# Patient Record
Sex: Male | Born: 1940 | Race: White | Hispanic: No | Marital: Single | Smoking: Never smoker
Health system: Southern US, Community
[De-identification: ages and names within clinical notes are randomized; demographics above are authoritative.]

## PROBLEM LIST (undated history)

## (undated) DIAGNOSIS — M1990 Unspecified osteoarthritis, unspecified site: Secondary | ICD-10-CM

## (undated) DIAGNOSIS — I10 Essential (primary) hypertension: Secondary | ICD-10-CM

## (undated) DIAGNOSIS — F79 Unspecified intellectual disabilities: Secondary | ICD-10-CM

## (undated) DIAGNOSIS — E785 Hyperlipidemia, unspecified: Secondary | ICD-10-CM

## (undated) DIAGNOSIS — R451 Restlessness and agitation: Secondary | ICD-10-CM

## (undated) HISTORY — PX: CATARACT EXTRACTION W/ INTRAOCULAR LENS  IMPLANT, BILATERAL: SHX1307

---

## 2013-01-02 ENCOUNTER — Emergency Department (HOSPITAL_COMMUNITY)
Admission: EM | Admit: 2013-01-02 | Discharge: 2013-01-02 | Disposition: A | Discharge status: Skilled Nursing Facility | Payer: Medicare Other | Attending: Emergency Medicine | Admitting: Emergency Medicine

## 2013-01-02 ENCOUNTER — Encounter (HOSPITAL_COMMUNITY): Payer: Self-pay | Admitting: Emergency Medicine

## 2013-01-02 ENCOUNTER — Emergency Department (HOSPITAL_COMMUNITY): Payer: Medicare Other

## 2013-01-02 DIAGNOSIS — S8000XA Contusion of unspecified knee, initial encounter: Secondary | ICD-10-CM | POA: Insufficient documentation

## 2013-01-02 DIAGNOSIS — W010XXA Fall on same level from slipping, tripping and stumbling without subsequent striking against object, initial encounter: Secondary | ICD-10-CM | POA: Insufficient documentation

## 2013-01-02 DIAGNOSIS — Y9289 Other specified places as the place of occurrence of the external cause: Secondary | ICD-10-CM | POA: Insufficient documentation

## 2013-01-02 DIAGNOSIS — I10 Essential (primary) hypertension: Secondary | ICD-10-CM | POA: Insufficient documentation

## 2013-01-02 DIAGNOSIS — S8001XA Contusion of right knee, initial encounter: Secondary | ICD-10-CM

## 2013-01-02 DIAGNOSIS — F79 Unspecified intellectual disabilities: Secondary | ICD-10-CM | POA: Insufficient documentation

## 2013-01-02 DIAGNOSIS — G20 Parkinson's disease: Secondary | ICD-10-CM | POA: Insufficient documentation

## 2013-01-02 DIAGNOSIS — M1990 Unspecified osteoarthritis, unspecified site: Secondary | ICD-10-CM | POA: Insufficient documentation

## 2013-01-02 DIAGNOSIS — W19XXXA Unspecified fall, initial encounter: Secondary | ICD-10-CM

## 2013-01-02 DIAGNOSIS — Z79899 Other long term (current) drug therapy: Secondary | ICD-10-CM | POA: Insufficient documentation

## 2013-01-02 DIAGNOSIS — Y9389 Activity, other specified: Secondary | ICD-10-CM | POA: Insufficient documentation

## 2013-01-02 DIAGNOSIS — G20A1 Parkinson's disease without dyskinesia, without mention of fluctuations: Secondary | ICD-10-CM | POA: Insufficient documentation

## 2013-01-02 HISTORY — DX: Essential (primary) hypertension: I10

## 2013-01-02 HISTORY — DX: Unspecified intellectual disabilities: F79

## 2013-01-02 HISTORY — DX: Unspecified osteoarthritis, unspecified site: M19.90

## 2013-01-02 LAB — COMPREHENSIVE METABOLIC PANEL
AST: 17 U/L (ref 0–37)
Albumin: 3.1 g/dL — ABNORMAL LOW (ref 3.5–5.2)
Alkaline Phosphatase: 60 U/L (ref 39–117)
BUN: 24 mg/dL — ABNORMAL HIGH (ref 6–23)
CO2: 25 mEq/L (ref 19–32)
Calcium: 9 mg/dL (ref 8.4–10.5)
Chloride: 95 mEq/L — ABNORMAL LOW (ref 96–112)
GFR calc Af Amer: >90 mL/min (ref >90)
Glucose, Bld: 103 mg/dL — ABNORMAL HIGH (ref 70–99)
Potassium: 4 mEq/L (ref 3.5–5.1)
Sodium: 130 mEq/L — ABNORMAL LOW (ref 135–145)
Total Protein: 6.7 g/dL (ref 6.0–8.3)

## 2013-01-02 LAB — CBC WITH DIFFERENTIAL/PLATELET
Basophils Relative: 0 % (ref 0–1)
Eosinophils Absolute: 0.2 10*3/uL (ref 0.0–0.7)
Eosinophils Relative: 3 % (ref 0–5)
HCT: 39.5 % (ref 39.0–52.0)
Lymphocytes Relative: 18 % (ref 12–46)
Lymphs Abs: 1.4 10*3/uL (ref 0.7–4.0)
MCH: 30.2 pg (ref 26.0–34.0)
MCHC: 34.7 g/dL (ref 30.0–36.0)
MCV: 87 fL (ref 78.0–100.0)
Monocytes Relative: 9 % (ref 3–12)
Neutro Abs: 5.3 10*3/uL (ref 1.7–7.7)
Neutrophils Relative %: 69 % (ref 43–77)
Platelets: 266 10*3/uL (ref 150–400)
RBC: 4.54 MIL/uL (ref 4.22–5.81)

## 2013-01-02 LAB — URINALYSIS, ROUTINE W REFLEX MICROSCOPIC
Glucose, UA: NEGATIVE mg/dL
Leukocytes, UA: NEGATIVE
Nitrite: NEGATIVE
Specific Gravity, Urine: 1.023 (ref 1.005–1.030)
pH: 5 (ref 5.0–8.0)

## 2013-01-02 LAB — TROPONIN I: Troponin I: <0.3 ng/mL (ref <0.30)

## 2013-01-02 MED ORDER — SODIUM CHLORIDE 0.9 % IV BOLUS (SEPSIS)
500.0000 mL | Freq: Once | INTRAVENOUS | Status: AC
  Administered 2013-01-02: 500 mL via INTRAVENOUS

## 2013-01-02 NOTE — ED Notes (Signed)
Pt from [facility] via EMS s/p fall. Pt hit head after tripping on his shoes, has mild abrasion to forehead, no LOC. Pt c/o R knee pain. Pt A&O and in NAD

## 2013-01-02 NOTE — ED Provider Notes (Signed)
CSN: [id number]     Arrival date & time 01/02/13  1632 History   First MD Initiated Contact with Patient 01/02/13 1717     Chief Complaint  Patient presents with  . Fall   (Consider location/radiation/quality/duration/timing/severity/associated sxs/prior Treatment) HPI Mr. Gary [name] is a 72 year old male who presents today from an assisted living facility with reports of a fall. He has a history of mental retardation and Parkinson's disease. He states he was walking with his cane when he lost his balance. He has some pain in his right knee and also thinks that he hit his head but does not know if he lost consciousness. He denies other pain or injuries from the fall. He does not think he lost consciousness. He denies chest pain, neck pain, back pain, weakness, or dyspnea. Past Medical History  Diagnosis Date  . Mental retardation   . Hypertension   . Parkinson's disease   . Osteoarthritis    History reviewed. No pertinent past surgical history. History reviewed. No pertinent family history. History  Substance Use Topics  . Smoking status: Not on file  . Smokeless tobacco: Not on file  . Alcohol Use: Not on file    Review of Systems  All other systems reviewed and are negative.    Allergies  Review of patient's allergies indicates no known allergies.  Home Medications   Current Outpatient Rx  Name  Route  Sig  Dispense  Refill  . chlorhexidine (PERIDEX) 0.12 % solution   Mouth/Throat   Use as directed 15 mLs in the mouth or throat 2 (two) times daily.         Marland Kitchen etodolac (LODINE) 400 MG tablet   Oral   Take 400 mg by mouth 2 (two) times daily.         . fish oil-omega-3 fatty acids 1000 MG capsule   Oral   Take 2 g by mouth 2 (two) times daily.         Marland Kitchen Perphenazine-Amitriptyline 4-50 MG TABS   Oral   Take 2 tablets by mouth at bedtime.         . propranolol ER (INDERAL LA) 60 MG 24 hr capsule   Oral   Take 60 mg by mouth daily.         . simvastatin  (ZOCOR) 20 MG tablet   Oral   Take 20 mg by mouth at bedtime.          BP 136/67  Pulse 62  Temp(Src) 98.7 F (37.1 C) (Oral)  Resp 14  SpO2 96% Physical Exam  Nursing note and vitals reviewed. Constitutional: He appears well-developed and well-nourished.  HENT:  Head: Normocephalic and atraumatic.  Right Ear: External ear normal.  Left Ear: External ear normal.  Nose: Nose normal.  Mouth/Throat: Oropharynx is clear and moist.  Lips appear dry  Eyes: Conjunctivae and EOM are normal. Pupils are equal, round, and reactive to light.  Neck: Normal range of motion. Neck supple.  Cardiovascular: Normal rate, regular rhythm, normal heart sounds and intact distal pulses.   Pulmonary/Chest: Effort normal and breath sounds normal.  Abdominal: Soft. Bowel sounds are normal.    ED Course  Procedures (including critical care time) Labs Review Labs Reviewed  COMPREHENSIVE METABOLIC PANEL - Abnormal; Notable for the following:    Sodium 130 (*)    Chloride 95 (*)    Glucose, Bld 103 (*)    BUN 24 (*)    Albumin 3.1 (*)    Total Bilirubin  0.2 (*)    GFR calc non Af Amer 88 (*)    All other components within normal limits  CBC WITH DIFFERENTIAL  TROPONIN I  URINALYSIS, ROUTINE W REFLEX MICROSCOPIC   Imaging Review Ct Head Wo Contrast  01/02/2013   CLINICAL DATA:  Fall, altered mental status  EXAM: CT HEAD WITHOUT CONTRAST  TECHNIQUE: Contiguous axial images were obtained from the base of the skull through the vertex without intravenous contrast.  COMPARISON:  Previous exam 11/03/2009 is not available for direct comparison. The report is reviewed.  FINDINGS: Mild cortical volume loss noted with proportional ventricular prominence. Areas of periventricular white matter hypodensity are most compatible with small vessel ischemic change. No acute hemorrhage, infarct, or mass lesion is identified. No midline shift. Orbits and paranasal sinuses are unremarkable. No skull fracture.   IMPRESSION: Chronic findings as above, no acute intracranial finding.   Electronically Signed   By: Christiana Pellant M.D.   On: 01/02/2013 18:24   Dg Knee Complete 4 Views Right  01/02/2013   CLINICAL DATA:  Fall, right knee pain  EXAM: RIGHT KNEE - COMPLETE 4+ VIEW  COMPARISON:  None.  FINDINGS: There is no evidence of fracture, dislocation, or joint effusion. There is no evidence of arthropathy or other focal bone abnormality. Soft tissues are unremarkable.  IMPRESSION: No acute osseous abnormality.   Electronically Signed   By: Genevive Bi M.D.   On: 01/02/2013 18:13   Date: 01/02/2013  Rate: 71  Rhythm: normal sinus rhythm  QRS Axis: left  Intervals: normal  ST/T Wave abnormalities: normal  Conduction Disutrbances:none  Narrative Interpretation:   Old EKG Reviewed: none available    MDM  No diagnosis found. This 72 year old male who presented for evaluation after fall nursing home. His not appear to have any acute injuries from the fall. It is unclear at the fall was totally mechanical but he appears stable medically here.  He is mildly hyponatremic The patient has been ambulated here without difficulty and will be discharged back to his assisted living care facility  Hilario Quarry, MD 01/02/13 2314

## 2013-01-02 NOTE — ED Notes (Signed)
Bed: JX91 Expected date:  Expected time:  Means of arrival:  Comments: EMS leg pain

## 2013-01-02 NOTE — ED Notes (Signed)
Pt made aware of need for urine sample. Pt given urinal

## 2013-01-02 NOTE — ED Notes (Signed)
Patient transported to X-ray 

## 2013-01-02 NOTE — ED Notes (Signed)
Pt ambulated without assistance

## 2013-01-02 NOTE — ED Notes (Signed)
Pt reminded the need for urine sample

## 2014-10-16 ENCOUNTER — Emergency Department (HOSPITAL_COMMUNITY)
Admission: EM | Admit: 2014-10-16 | Discharge: 2014-10-16 | Disposition: A | Discharge status: Home / Self Care | Payer: Medicare Other | Attending: Emergency Medicine | Admitting: Emergency Medicine

## 2014-10-16 ENCOUNTER — Encounter (HOSPITAL_COMMUNITY): Payer: Self-pay | Admitting: *Deleted

## 2014-10-16 ENCOUNTER — Emergency Department (HOSPITAL_COMMUNITY): Payer: Medicare Other

## 2014-10-16 DIAGNOSIS — Z8659 Personal history of other mental and behavioral disorders: Secondary | ICD-10-CM | POA: Diagnosis not present

## 2014-10-16 DIAGNOSIS — Z79899 Other long term (current) drug therapy: Secondary | ICD-10-CM | POA: Insufficient documentation

## 2014-10-16 DIAGNOSIS — M1990 Unspecified osteoarthritis, unspecified site: Secondary | ICD-10-CM | POA: Insufficient documentation

## 2014-10-16 DIAGNOSIS — Y999 Unspecified external cause status: Secondary | ICD-10-CM | POA: Diagnosis not present

## 2014-10-16 DIAGNOSIS — S0993XA Unspecified injury of face, initial encounter: Secondary | ICD-10-CM | POA: Diagnosis present

## 2014-10-16 DIAGNOSIS — I10 Essential (primary) hypertension: Secondary | ICD-10-CM | POA: Diagnosis not present

## 2014-10-16 DIAGNOSIS — S0181XA Laceration without foreign body of other part of head, initial encounter: Secondary | ICD-10-CM

## 2014-10-16 DIAGNOSIS — Y939 Activity, unspecified: Secondary | ICD-10-CM | POA: Insufficient documentation

## 2014-10-16 DIAGNOSIS — G20 Parkinson's disease: Secondary | ICD-10-CM | POA: Diagnosis not present

## 2014-10-16 DIAGNOSIS — W19XXXA Unspecified fall, initial encounter: Secondary | ICD-10-CM

## 2014-10-16 DIAGNOSIS — Y92128 Other place in nursing home as the place of occurrence of the external cause: Secondary | ICD-10-CM | POA: Insufficient documentation

## 2014-10-16 DIAGNOSIS — W1839XA Other fall on same level, initial encounter: Secondary | ICD-10-CM | POA: Insufficient documentation

## 2014-10-16 DIAGNOSIS — S022XXA Fracture of nasal bones, initial encounter for closed fracture: Secondary | ICD-10-CM | POA: Diagnosis not present

## 2014-10-16 MED ORDER — IBUPROFEN 800 MG PO TABS: 800.0000 mg | ORAL_TABLET | Freq: Three times a day (TID) | ORAL | Status: AC | PRN

## 2014-10-16 NOTE — Discharge Instructions (Signed)
Return here as needed.  Followup with your primary care Dr. for recheck °

## 2014-10-16 NOTE — ED Notes (Signed)
Bed: WA03 Expected date:  Expected time:  Means of arrival:  Comments: Fall, face lac

## 2014-10-16 NOTE — ED Provider Notes (Signed)
CSN: [id number]     Arrival date & time 10/16/14  1822 History   First MD Initiated Contact with Patient 10/16/14 1831     Chief Complaint  Patient presents with  . Fall     (Consider location/radiation/quality/duration/timing/severity/associated sxs/prior Treatment) HPI Patient presents to the emergency department following a fall that occurred just prior to arrival.  The patient was outside when his walker got caught on the sidewalk and he fell forward.  The patient did not lose consciousness, and the staff was outside at the time.  Patient has a laceration above his right eye brow abrasions to the nose and lip.  The patient is unable to tell me what happened during the fall.  Patient is unable to tell me if he has any injuries.  He states that he has pain around the nose Past Medical History  Diagnosis Date  . Mental retardation   . Hypertension   . Parkinson's disease   . Osteoarthritis    History reviewed. No pertinent past surgical history. History reviewed. No pertinent family history. History  Substance Use Topics  . Smoking status: Never Smoker   . Smokeless tobacco: Not on file  . Alcohol Use: No    Review of Systems  Level 5 caveat applies due to mental retardation  Allergies  Review of patient's allergies indicates no known allergies.  Home Medications   Prior to Admission medications   Medication Sig Start Date End Date Taking? Authorizing Provider  atorvastatin (LIPITOR) 10 MG tablet Take 10 mg by mouth at bedtime.   Yes Historical Provider, MD  chlorhexidine (PERIDEX) 0.12 % solution Use as directed 15 mLs in the mouth or throat 2 (two) times daily.   Yes Historical Provider, MD  ENSURE (ENSURE) Take 237 mLs by mouth 3 (three) times daily between meals.   Yes Historical Provider, MD  etodolac (LODINE) 400 MG tablet Take 400 mg by mouth 2 (two) times daily. TAKE AT 9AM AND THEN AT 5PM WITH FOOD   Yes Historical Provider, MD  fish oil-omega-3 fatty acids 1000  MG capsule Take 2 g by mouth 2 (two) times daily.   Yes Historical Provider, MD  perphenazine (TRILAFON) 8 MG tablet Take 8 mg by mouth at bedtime.   Yes Historical Provider, MD  triamcinolone (KENALOG) 0.025 % cream Apply 1 application topically 4 (four) times daily as needed (FOR AREAS OF ITCHING).   Yes Historical Provider, MD   BP 137/72 mmHg  Pulse 82  Temp(Src) 99.7 F (37.6 C) (Oral)  Resp 16  SpO2 91% Physical Exam  Constitutional: He is oriented to person, place, and time. He appears well-developed and well-nourished. No distress.  HENT:  Head: Normocephalic.    Nose:    Mouth/Throat: Oropharynx is clear and moist.  Eyes: Pupils are equal, round, and reactive to light.  Neck: Normal range of motion. Neck supple.  Cardiovascular: Normal rate, regular rhythm and normal heart sounds.  Exam reveals no gallop and no friction rub.   No murmur heard. Pulmonary/Chest: Effort normal and breath sounds normal. No respiratory distress.  Neurological: He is alert and oriented to person, place, and time. He exhibits normal muscle tone. Coordination normal.  Skin: Skin is warm and dry. No rash noted. No erythema.  Nursing note and vitals reviewed.   ED Course  Procedures (including critical care time) Labs Review Labs Reviewed - No data to display  Imaging Review Ct Head Wo Contrast  10/16/2014   CLINICAL DATA:  Recent fall today  with multiple facial abrasions, pain  EXAM: CT HEAD WITHOUT CONTRAST  CT MAXILLOFACIAL WITHOUT CONTRAST  CT CERVICAL SPINE WITHOUT CONTRAST  TECHNIQUE: Multidetector CT imaging of the head, cervical spine, and maxillofacial structures were performed using the standard protocol without intravenous contrast. Multiplanar CT image reconstructions of the cervical spine and maxillofacial structures were also generated.  COMPARISON:  None.  FINDINGS: CT HEAD FINDINGS  Bony calvarium is intact. Considerable soft tissue swelling is noted in the right frontal and right  periorbital region. Atrophic changes and chronic white matter ischemic change is seen. No findings to suggest acute hemorrhage, acute infarction or space-occupying mass lesion are noted.  CT MAXILLOFACIAL FINDINGS  Minimally displaced right nasal bone fracture is noted. No blowout fracture is identified. No other fractures are seen. The orbits and their contents are within normal limits. Considerable right periorbital soft tissue swelling is again identified. The ostiomeatal complex is within normal limits. No other soft tissue abnormality is seen.  CT CERVICAL SPINE FINDINGS  Seven cervical segments are well visualized. Vertebral body height is well maintained. Disc space narrowing is noted from C3-C7 with osteophytic changes. Mild anterolisthesis of C2 on C3 is noted of a degenerative nature. Multilevel facet hypertrophic changes are seen. No acute fracture or acute facet abnormality is noted. Visualized upper lung apices are within normal limits.  IMPRESSION: CT of the head: Right frontal and periorbital soft tissue swelling consistent with the history.  No acute intracranial abnormality noted.  Chronic atrophic and ischemic changes.  CT of the maxillofacial bones: Mildly displaced right nasal bone fracture.  Right facial soft tissue swelling consistent with the recent injury.  CT of the cervical spine: Degenerative changes without acute abnormality.   Electronically Signed   By: Alcide CleverMark  Lukens M.D.   On: 10/16/2014 19:20   Ct Cervical Spine Wo Contrast  10/16/2014   CLINICAL DATA:  Recent fall today with multiple facial abrasions, pain  EXAM: CT HEAD WITHOUT CONTRAST  CT MAXILLOFACIAL WITHOUT CONTRAST  CT CERVICAL SPINE WITHOUT CONTRAST  TECHNIQUE: Multidetector CT imaging of the head, cervical spine, and maxillofacial structures were performed using the standard protocol without intravenous contrast. Multiplanar CT image reconstructions of the cervical spine and maxillofacial structures were also generated.   COMPARISON:  None.  FINDINGS: CT HEAD FINDINGS  Bony calvarium is intact. Considerable soft tissue swelling is noted in the right frontal and right periorbital region. Atrophic changes and chronic white matter ischemic change is seen. No findings to suggest acute hemorrhage, acute infarction or space-occupying mass lesion are noted.  CT MAXILLOFACIAL FINDINGS  Minimally displaced right nasal bone fracture is noted. No blowout fracture is identified. No other fractures are seen. The orbits and their contents are within normal limits. Considerable right periorbital soft tissue swelling is again identified. The ostiomeatal complex is within normal limits. No other soft tissue abnormality is seen.  CT CERVICAL SPINE FINDINGS  Seven cervical segments are well visualized. Vertebral body height is well maintained. Disc space narrowing is noted from C3-C7 with osteophytic changes. Mild anterolisthesis of C2 on C3 is noted of a degenerative nature. Multilevel facet hypertrophic changes are seen. No acute fracture or acute facet abnormality is noted. Visualized upper lung apices are within normal limits.  IMPRESSION: CT of the head: Right frontal and periorbital soft tissue swelling consistent with the history.  No acute intracranial abnormality noted.  Chronic atrophic and ischemic changes.  CT of the maxillofacial bones: Mildly displaced right nasal bone fracture.  Right facial soft tissue  swelling consistent with the recent injury.  CT of the cervical spine: Degenerative changes without acute abnormality.   Electronically Signed   By: Alcide Clever M.D.   On: 10/16/2014 19:20   Ct Maxillofacial Wo Cm  10/16/2014   CLINICAL DATA:  Recent fall today with multiple facial abrasions, pain  EXAM: CT HEAD WITHOUT CONTRAST  CT MAXILLOFACIAL WITHOUT CONTRAST  CT CERVICAL SPINE WITHOUT CONTRAST  TECHNIQUE: Multidetector CT imaging of the head, cervical spine, and maxillofacial structures were performed using the standard protocol  without intravenous contrast. Multiplanar CT image reconstructions of the cervical spine and maxillofacial structures were also generated.  COMPARISON:  None.  FINDINGS: CT HEAD FINDINGS  Bony calvarium is intact. Considerable soft tissue swelling is noted in the right frontal and right periorbital region. Atrophic changes and chronic white matter ischemic change is seen. No findings to suggest acute hemorrhage, acute infarction or space-occupying mass lesion are noted.  CT MAXILLOFACIAL FINDINGS  Minimally displaced right nasal bone fracture is noted. No blowout fracture is identified. No other fractures are seen. The orbits and their contents are within normal limits. Considerable right periorbital soft tissue swelling is again identified. The ostiomeatal complex is within normal limits. No other soft tissue abnormality is seen.  CT CERVICAL SPINE FINDINGS  Seven cervical segments are well visualized. Vertebral body height is well maintained. Disc space narrowing is noted from C3-C7 with osteophytic changes. Mild anterolisthesis of C2 on C3 is noted of a degenerative nature. Multilevel facet hypertrophic changes are seen. No acute fracture or acute facet abnormality is noted. Visualized upper lung apices are within normal limits.  IMPRESSION: CT of the head: Right frontal and periorbital soft tissue swelling consistent with the history.  No acute intracranial abnormality noted.  Chronic atrophic and ischemic changes.  CT of the maxillofacial bones: Mildly displaced right nasal bone fracture.  Right facial soft tissue swelling consistent with the recent injury.  CT of the cervical spine: Degenerative changes without acute abnormality.   Electronically Signed   By: Alcide Clever M.D.   On: 10/16/2014 19:20   LACERATION REPAIR Performed by: Carlyle Dolly Authorized by: Carlyle Dolly Consent: Verbal consent obtained. Risks and benefits: risks, benefits and alternatives were discussed Consent given  by: patient Patient identity confirmed: provided demographic data Prepped and Draped in normal sterile fashion Wound explored  Laceration Location: Right 4 head just above the right eyebrow  Laceration Length: 7 cm  No Foreign Bodies seen or palpated  Anesthesia: local infiltration  Local anesthetic: None   Anesthetic total: Not applicable Irrigation method: syringe Amount of cleaning: standard  Skin closure: Dermabond   Number of sutures: Dermabond   Technique: Dermabond   Patient tolerance: Patient tolerated the procedure well with no immediate complications.    Patient will be discharged home to the nursing facility told to return here as needed.  Advised follow-up with his primary care doctor  Charlestine Night, PA-C 10/16/14 2110  Linwood Dibbles, MD 10/16/14 2113

## 2014-10-16 NOTE — ED Notes (Signed)
[facility].  Hit pavement, no LOC. Has 3" laceration above right eyebrow and several other facial abrasions on face. Right knee has abrasion as well. Hx - Mental Retardation

## 2014-12-07 ENCOUNTER — Emergency Department (HOSPITAL_COMMUNITY): Payer: Medicare Other

## 2014-12-07 ENCOUNTER — Encounter (HOSPITAL_COMMUNITY): Payer: Self-pay | Admitting: Emergency Medicine

## 2014-12-07 ENCOUNTER — Emergency Department (HOSPITAL_COMMUNITY)
Admission: EM | Admit: 2014-12-07 | Discharge: 2014-12-07 | Disposition: A | Discharge status: Skilled Nursing Facility | Payer: Medicare Other | Attending: Emergency Medicine | Admitting: Emergency Medicine

## 2014-12-07 DIAGNOSIS — R05 Cough: Secondary | ICD-10-CM | POA: Insufficient documentation

## 2014-12-07 DIAGNOSIS — I10 Essential (primary) hypertension: Secondary | ICD-10-CM | POA: Insufficient documentation

## 2014-12-07 DIAGNOSIS — Z79899 Other long term (current) drug therapy: Secondary | ICD-10-CM | POA: Diagnosis not present

## 2014-12-07 DIAGNOSIS — J069 Acute upper respiratory infection, unspecified: Secondary | ICD-10-CM

## 2014-12-07 DIAGNOSIS — G20 Parkinson's disease: Secondary | ICD-10-CM | POA: Insufficient documentation

## 2014-12-07 DIAGNOSIS — B9789 Other viral agents as the cause of diseases classified elsewhere: Secondary | ICD-10-CM

## 2014-12-07 DIAGNOSIS — M1990 Unspecified osteoarthritis, unspecified site: Secondary | ICD-10-CM | POA: Diagnosis not present

## 2014-12-07 DIAGNOSIS — R0602 Shortness of breath: Secondary | ICD-10-CM | POA: Diagnosis present

## 2014-12-07 LAB — CBC WITH DIFFERENTIAL/PLATELET
BASOS ABS: 0 10*3/uL (ref 0.0–0.1)
Basophils Relative: 0 % (ref 0–1)
Eosinophils Absolute: 0.2 10*3/uL (ref 0.0–0.7)
Eosinophils Relative: 3 % (ref 0–5)
HEMATOCRIT: 39.3 % (ref 39.0–52.0)
Hemoglobin: 13 g/dL (ref 13.0–17.0)
LYMPHS PCT: 13 % (ref 12–46)
Lymphs Abs: 1 10*3/uL (ref 0.7–4.0)
MCH: 31.5 pg (ref 26.0–34.0)
MCHC: 33.1 g/dL (ref 30.0–36.0)
MCV: 95.2 fL (ref 78.0–100.0)
MONO ABS: 0.7 10*3/uL (ref 0.1–1.0)
Monocytes Relative: 8 % (ref 3–12)
Neutro Abs: 6 10*3/uL (ref 1.7–7.7)
Neutrophils Relative %: 76 % (ref 43–77)
Platelets: 264 10*3/uL (ref 150–400)
RBC: 4.13 MIL/uL — AB (ref 4.22–5.81)
RDW: 13.3 % (ref 11.5–15.5)
WBC: 7.9 10*3/uL (ref 4.0–10.5)

## 2014-12-07 LAB — URINALYSIS, ROUTINE W REFLEX MICROSCOPIC
Glucose, UA: NEGATIVE mg/dL
Hgb urine dipstick: NEGATIVE
KETONES UR: NEGATIVE mg/dL
LEUKOCYTES UA: NEGATIVE
NITRITE: NEGATIVE
PROTEIN: NEGATIVE mg/dL
Specific Gravity, Urine: 1.016 (ref 1.005–1.030)
UROBILINOGEN UA: 0.2 mg/dL (ref 0.0–1.0)
pH: 7 (ref 5.0–8.0)

## 2014-12-07 LAB — BASIC METABOLIC PANEL
ANION GAP: 7 (ref 5–15)
BUN: 19 mg/dL (ref 6–20)
CALCIUM: 8.7 mg/dL — AB (ref 8.9–10.3)
CO2: 28 mmol/L (ref 22–32)
Chloride: 99 mmol/L — ABNORMAL LOW (ref 101–111)
Creatinine, Ser: 0.9 mg/dL (ref 0.61–1.24)
GLUCOSE: 117 mg/dL — AB (ref 65–99)
Potassium: 4 mmol/L (ref 3.5–5.1)
Sodium: 134 mmol/L — ABNORMAL LOW (ref 135–145)

## 2014-12-07 LAB — I-STAT TROPONIN, ED: Troponin i, poc: 0 ng/mL (ref 0.00–0.08)

## 2014-12-07 LAB — BRAIN NATRIURETIC PEPTIDE: B NATRIURETIC PEPTIDE 5: 44.9 pg/mL (ref 0.0–100.0)

## 2014-12-07 MED ORDER — IPRATROPIUM BROMIDE 0.02 % IN SOLN
0.5000 mg | Freq: Once | RESPIRATORY_TRACT | Status: AC
  Administered 2014-12-07: 0.5 mg via RESPIRATORY_TRACT
  Filled 2014-12-07: qty 2.5

## 2014-12-07 MED ORDER — SODIUM CHLORIDE 0.9 % IV BOLUS (SEPSIS)
500.0000 mL | Freq: Once | INTRAVENOUS | Status: AC
  Administered 2014-12-07: 500 mL via INTRAVENOUS

## 2014-12-07 MED ORDER — ALBUTEROL SULFATE (2.5 MG/3ML) 0.083% IN NEBU
5.0000 mg | INHALATION_SOLUTION | Freq: Once | RESPIRATORY_TRACT | Status: AC
  Administered 2014-12-07: 5 mg via RESPIRATORY_TRACT
  Filled 2014-12-07: qty 6

## 2014-12-07 NOTE — ED Provider Notes (Signed)
CSN: [id number]     Arrival date & time 12/07/14  1741 History   First MD Initiated Contact with Patient 12/07/14 1753     Chief Complaint  Patient presents with  . Shortness of Breath     (Consider location/radiation/quality/duration/timing/severity/associated sxs/prior Treatment) HPI  Patient is a 74 year old male with a history of mental retardation, hypertension, OSA, Parkinson's, he is brought in from a nursing home for reports of exertional shortness of breath. He was apparently coming in from outside time at his facility and was very short of breath.  There is reported to day history of shortness of breath. He lives at [facility] on [address].  He has no history of pulmonary disease or CHF.  The patient is able to answer some yes or no questions. He denies any pain in his chest.  He does state that when I lay him flat he feels more short of breath.  He denies belly pain.    Level 5 caveat, history limited due to patient's mental status, has mental retardation, is brought in from a nursing home, available people records at the bedside land no additional history other than with what was reported from EMS to the nurse doing the triage.  Past Medical History  Diagnosis Date  . Mental retardation   . Hypertension   . Parkinson's disease   . Osteoarthritis    History reviewed. No pertinent past surgical history. No family history on file. Social History  Substance Use Topics  . Smoking status: Never Smoker   . Smokeless tobacco: None  . Alcohol Use: No    Review of Systems  Unable to perform ROS - due to mental retardation, hx limited  Allergies  Review of patient's allergies indicates no known allergies.  Home Medications   Prior to Admission medications   Medication Sig Start Date End Date Taking? Authorizing Provider  amitriptyline (ELAVIL) 100 MG tablet Take 100 mg by mouth at bedtime.   Yes Historical Provider, MD  atorvastatin (LIPITOR) 20 MG tablet Take 20 mg by  mouth at bedtime.   Yes Historical Provider, MD  chlorhexidine (PERIDEX) 0.12 % solution Use as directed 15 mLs in the mouth or throat 2 (two) times daily.   Yes Historical Provider, MD  divalproex (DEPAKOTE) 250 MG DR tablet Take 250 mg by mouth 2 (two) times daily.   Yes Historical Provider, MD  ENSURE (ENSURE) Take 237 mLs by mouth 3 (three) times daily between meals.   Yes Historical Provider, MD  etodolac (LODINE) 400 MG tablet Take 400 mg by mouth 2 (two) times daily. TAKE AT 9AM AND THEN AT 5PM WITH FOOD   Yes Historical Provider, MD  fish oil-omega-3 fatty acids 1000 MG capsule Take 2 g by mouth 2 (two) times daily.   Yes Historical Provider, MD  hydrOXYzine (ATARAX/VISTARIL) 25 MG tablet Take 25 mg by mouth every 4 (four) hours as needed for itching.   Yes Historical Provider, MD  perphenazine (TRILAFON) 2 MG tablet Take 2 mg by mouth daily.   Yes Historical Provider, MD  perphenazine (TRILAFON) 4 MG tablet Take 4 mg by mouth daily.   Yes Historical Provider, MD  atorvastatin (LIPITOR) 10 MG tablet Take 10 mg by mouth at bedtime.    Historical Provider, MD  ibuprofen (ADVIL,MOTRIN) 800 MG tablet Take 1 tablet (800 mg total) by mouth every 8 (eight) hours as needed. 10/16/14   Charlestine Night, PA-C  triamcinolone (KENALOG) 0.025 % cream Apply 1 application topically 4 (four)  times daily as needed (FOR AREAS OF ITCHING).    Historical Provider, MD   BP 137/68 mmHg  Pulse 67  Temp(Src) 99.2 F (37.3 C) (Rectal)  Resp 13  SpO2 97% Physical Exam  Constitutional: He appears well-developed and well-nourished. No distress.  HENT:  Head: Normocephalic and atraumatic.  Nose: Nose normal.  Mouth/Throat: No oropharyngeal exudate.  Oral mucosa dry  Eyes: Conjunctivae and EOM are normal. Pupils are equal, round, and reactive to light. Right eye exhibits no discharge. Left eye exhibits no discharge. No scleral icterus.  Neck: Normal range of motion. No JVD present. No tracheal deviation  present. No thyromegaly present.  Cardiovascular: Normal rate, regular rhythm, normal heart sounds and intact distal pulses.  Exam reveals no gallop and no friction rub.   No murmur heard. No pitting edema  Pulmonary/Chest: Effort normal. No respiratory distress. He has no wheezes. He has rales. He exhibits no tenderness.  Diffuse rales, coarse bilaterally at the bases R>L, poor inspiratory effort, no wheeze, no rhonchi, frequent wet productive cough  Abdominal: Soft. Bowel sounds are normal. He exhibits no distension and no mass. There is no tenderness. There is no rebound and no guarding.  Musculoskeletal: Normal range of motion. He exhibits no edema or tenderness.  Lymphadenopathy:    He has no cervical adenopathy.  Neurological: He is alert. He has normal reflexes. No cranial nerve deficit. Coordination normal.  Oriented to person, oral motor tic present - tongue thrusting  Skin: Skin is warm and dry. No rash noted. He is not diaphoretic. No erythema. There is pallor.  Psychiatric: He has a normal mood and affect. His behavior is normal. Judgment and thought content normal.  Nursing note and vitals reviewed.   ED Course  Procedures (including critical care time) Labs Review Labs Reviewed  CBC WITH DIFFERENTIAL/PLATELET - Abnormal; Notable for the following:    RBC 4.13 (*)    All other components within normal limits  URINALYSIS, ROUTINE W REFLEX MICROSCOPIC (NOT AT [facility]) - Abnormal; Notable for the following:    Bilirubin Urine LARGE (*)    All other components within normal limits  BASIC METABOLIC PANEL - Abnormal; Notable for the following:    Sodium 134 (*)    Chloride 99 (*)    Glucose, Bld 117 (*)    Calcium 8.7 (*)    All other components within normal limits  BRAIN NATRIURETIC PEPTIDE  I-STAT TROPOININ, ED    Imaging Review Dg Chest 2 View  12/07/2014   CLINICAL DATA:  Shortness of breath for 2 days.  EXAM: CHEST  2 VIEW  COMPARISON:  None.  FINDINGS: Lung volumes  are low with mild basilar atelectasis. No consolidative process, pneumothorax or effusion is identified. No focal bony abnormality.  IMPRESSION: No acute disease.   Electronically Signed   By: Drusilla Kanner M.D.   On: 12/07/2014 18:43   I have personally reviewed and evaluated these images and lab results as part of my medical decision-making.   EKG Interpretation   Date/Time:  Wednesday December 07 2014 17:54:32 EDT Ventricular Rate:  77 PR Interval:  186 QRS Duration: 98 QT Interval:  403 QTC Calculation: 456 R Axis:   19 Text Interpretation:  Sinus rhythm J point elevations in anterior leads No  significant change since Reconfirmed by LIU MD, DANA [phone]) on 12/08/2014  1:00:23 AM      MDM   Final diagnoses:  Viral URI with cough    Patient brought in from nursing home,  history of mental retardation, reports that 2 days of increasing shortness of breath, was observed to have exertional dyspnea, when laying patient down flat in bed complaints of increasing shortness of breath -  ddx PNA vs CHF  Pt does not have hx of pulmonary or cardiac dz - plan to get EKG, CXR, CBC, BMP, BNP, trop  Rectal temp was performed at bedside, 99.2, he is not tachycardic, RA oxygen saturation of 91 - 95  Patient had a negative chest x-ray, no pneumonia, no pulmonary edema, BNP was negative, troponin was negative, no EKG changes Patient was given a DuoNeb, with re-evaluation, pt has improved breath sounds throughout His pulse ox on room air is 97% after breathing treatment. He has had no tachycardia, and has not had any respiratory distress We will walk the patient with pulse ox, and he does well without any desaturation he'll be discharged home.  Pt ambulated without difficulty with O2 sats- 97% Dr. Verdie Mosher has also seen and evaluated pt, agrees with pt going home if he is able to maintain his sats.  F/up outpt with his PCP.  Medications  albuterol (PROVENTIL) (2.5 MG/3ML) 0.083% nebulizer  solution 5 mg (5 mg Nebulization Given 12/07/14 1913)  ipratropium (ATROVENT) nebulizer solution 0.5 mg (0.5 mg Nebulization Given 12/07/14 1913)  sodium chloride 0.9 % bolus 500 mL (0 mLs Intravenous Stopped 12/07/14 2251)   Filed Vitals:   12/07/14 1913 12/07/14 1953 12/07/14 2006 12/07/14 2215  BP:  127/72 127/72 137/68  Pulse:  73 76 67  Temp:      TempSrc:      Resp:   23 13  SpO2: 96% 94% 97% 97%     Danelle Berry, PA-C 12/08/14 0324  Lavera Guise, MD 12/08/14 [phone]

## 2014-12-07 NOTE — ED Notes (Signed)
Pt presents from [facility] via EMS for SOB x2 days. Per EMS absent RL lung sounds, clear in other lobes.   Last VS: 136/88, 96%RA, 78HR.

## 2014-12-07 NOTE — ED Notes (Signed)
Bed: ZO10 Expected date:  Expected time:  Means of arrival:  Comments: EMS- 19s M, SOB w/ exertion

## 2014-12-07 NOTE — Discharge Instructions (Signed)
Cough, Adult   A cough is a reflex. It helps you clear your throat and airways. A cough can help heal your body. A cough can last 2 or 3 weeks (acute) or may last more than 8 weeks (chronic). Some common causes of a cough can include an infection, allergy, or a cold.  HOME CARE  · Only take medicine as told by your doctor.  · If given, take your medicines (antibiotics) as told. Finish them even if you start to feel better.  · Use a cold steam vaporizer or humidifier in your home. This can help loosen thick spit (secretions).  · Sleep so you are almost sitting up (semi-upright). Use pillows to do this. This helps reduce coughing.  · Rest as needed.  · Stop smoking if you smoke.  GET HELP RIGHT AWAY IF:  · You have yellowish-white fluid (pus) in your thick spit.  · Your cough gets worse.  · Your medicine does not reduce coughing, and you are losing sleep.  · You cough up blood.  · You have trouble breathing.  · Your pain gets worse and medicine does not help.  · You have a fever.  MAKE SURE YOU:   · Understand these instructions.  · Will watch your condition.  · Will get help right away if you are not doing well or get worse.  Document Released: 11/29/2010 Document Revised: 08/02/2013 Document Reviewed: 11/29/2010  ExitCare® Patient Information ©2015 [employer]. This information is not intended to replace advice given to you by your health care provider. Make sure you discuss any questions you have with your health care provider.

## 2014-12-07 NOTE — ED Notes (Signed)
Pt ambulated for about 5 feet O2 stats stayed above 97%.

## 2014-12-07 NOTE — Progress Notes (Signed)
Patient is resident at [facility] in [place].  PCP listed on facility paperwork Dr.  Debbora Dus.  System updated.

## 2014-12-17 ENCOUNTER — Encounter (HOSPITAL_COMMUNITY): Payer: Self-pay | Admitting: Emergency Medicine

## 2014-12-17 ENCOUNTER — Emergency Department (HOSPITAL_COMMUNITY)
Admission: EM | Admit: 2014-12-17 | Discharge: 2014-12-17 | Disposition: A | Discharge status: Home / Self Care | Payer: Medicare Other | Attending: Emergency Medicine | Admitting: Emergency Medicine

## 2014-12-17 ENCOUNTER — Emergency Department (HOSPITAL_COMMUNITY): Payer: Medicare Other

## 2014-12-17 DIAGNOSIS — I10 Essential (primary) hypertension: Secondary | ICD-10-CM | POA: Diagnosis not present

## 2014-12-17 DIAGNOSIS — G20 Parkinson's disease: Secondary | ICD-10-CM | POA: Diagnosis not present

## 2014-12-17 DIAGNOSIS — F79 Unspecified intellectual disabilities: Secondary | ICD-10-CM | POA: Diagnosis not present

## 2014-12-17 DIAGNOSIS — M1990 Unspecified osteoarthritis, unspecified site: Secondary | ICD-10-CM | POA: Diagnosis not present

## 2014-12-17 DIAGNOSIS — R0989 Other specified symptoms and signs involving the circulatory and respiratory systems: Secondary | ICD-10-CM | POA: Diagnosis present

## 2014-12-17 DIAGNOSIS — Z79899 Other long term (current) drug therapy: Secondary | ICD-10-CM | POA: Diagnosis not present

## 2014-12-17 DIAGNOSIS — T17308A Unspecified foreign body in larynx causing other injury, initial encounter: Secondary | ICD-10-CM

## 2014-12-17 NOTE — ED Notes (Signed)
Bed: [facility] Expected date:  Expected time:  Means of arrival:  Comments: Ems- choking

## 2014-12-17 NOTE — ED Provider Notes (Signed)
CSN: [id number]     Arrival date & time 12/17/14  1318 History   First MD Initiated Contact with Patient 12/17/14 1342     No chief complaint on file.     HPI  Expand All Collapse All   Patient from [facility] assisted living who choked on a piece of chicken. Nurses did chest compressions and patient coughed up a huge piece of chicken. Patient alert and moving air throughout the incident. Patient back to baseline, history of Down's syndrome. According to EMS patient can comprehend but cannot verbally respond. Uses gestures to communicate needs. Lungs clear bilaterally. 02 sats 95% on room air.        Past Medical History  Diagnosis Date  . Mental retardation   . Hypertension   . Parkinson's disease   . Osteoarthritis    History reviewed. No pertinent past surgical history. No family history on file. Social History  Substance Use Topics  . Smoking status: Never Smoker   . Smokeless tobacco: None  . Alcohol Use: No    Review of Systems  Unable to perform ROS     Allergies  Review of patient's allergies indicates no known allergies.  Home Medications   Prior to Admission medications   Medication Sig Start Date End Date Taking? Authorizing Jatavious Peppard  amitriptyline (ELAVIL) 100 MG tablet Take 100 mg by mouth at bedtime.   Yes Historical Maytal Mijangos, MD  atorvastatin (LIPITOR) 20 MG tablet Take 20 mg by mouth at bedtime.   Yes Historical Auriel Kist, MD  chlorhexidine (PERIDEX) 0.12 % solution Use as directed 15 mLs in the mouth or throat 2 (two) times daily.   Yes Historical Giovanna Kemmerer, MD  divalproex (DEPAKOTE) 500 MG DR tablet Take 500 mg by mouth 2 (two) times daily.   Yes Historical Aalivia Mcgraw, MD  ENSURE (ENSURE) Take 237 mLs by mouth 3 (three) times daily between meals.   Yes Historical Stark Aguinaga, MD  etodolac (LODINE) 400 MG tablet Take 400 mg by mouth 2 (two) times daily.    Yes Historical Prospero Mahnke, MD  fish oil-omega-3 fatty acids 1000 MG capsule Take 2 g by  mouth 2 (two) times daily.   Yes Historical Shantea Poulton, MD  hydrOXYzine (ATARAX/VISTARIL) 25 MG tablet Take 25 mg by mouth every 4 (four) hours as needed for itching.   Yes Historical Kerin Kren, MD  perphenazine (TRILAFON) 2 MG tablet Take 2 mg by mouth daily after breakfast.    Yes Historical Ryanne Morand, MD  perphenazine (TRILAFON) 4 MG tablet Take 4 mg by mouth daily after breakfast.    Yes Historical Earnie Bechard, MD  triamcinolone (KENALOG) 0.025 % cream Apply 1 application topically 4 (four) times daily as needed (FOR AREAS OF ITCHING).   Yes Historical Lyndsey Demos, MD  ibuprofen (ADVIL,MOTRIN) 800 MG tablet Take 1 tablet (800 mg total) by mouth every 8 (eight) hours as needed. Patient not taking: Reported on 12/17/2014 10/16/14   Charlestine Night, PA-C   BP 122/68 mmHg  Pulse 68  Temp(Src) 98.7 F (37.1 C) (Oral)  Resp 18  SpO2 92% Physical Exam  Constitutional: He appears well-developed and well-nourished. No distress.  HENT:  Head: Normocephalic and atraumatic.  Mouth/Throat: Uvula is midline and oropharynx is clear and moist.  Eyes: Pupils are equal, round, and reactive to light.  Neck: Normal range of motion.  Cardiovascular: Normal rate and intact distal pulses.   Pulmonary/Chest: No respiratory distress. He has no wheezes. He has no rales.  Abdominal: Normal appearance. He exhibits no distension.  Musculoskeletal: Normal  range of motion.  Neurological: He is alert. No cranial nerve deficit.  Skin: Skin is warm and dry. No rash noted.  Psychiatric: He has a normal mood and affect. His behavior is normal.  Nursing note and vitals reviewed.   ED Course  Procedures (including critical care time) Labs Review Labs Reviewed - No data to display  Imaging Review Dg Neck Soft Tissue  12/17/2014   CLINICAL DATA:  SOb today; hx mental retardation, tech was not able to obtain any hx from pt; per RN's note: Patient from [facility] assisted living who choked on a piece of chicken.  Nurses did chest compressions and patient coughed up a piece of chicken. Patient non back to baseline. History of Down's syndrome.  EXAM: NECK SOFT TISSUES - 1+ VIEW  COMPARISON:  10/16/2014  FINDINGS: Airway is widely patent. No soft tissue masses or fullness. No evidence of a foreign body. Degenerative changes noted throughout the cervical spine. Bones are demineralized.  IMPRESSION: No evidence of foreign body. Soft tissues unremarkable. Airway widely patent.   Electronically Signed   By: Amie Portland M.D.   On: 12/17/2014 14:12   Dg Chest 2 View  12/17/2014   CLINICAL DATA:  Mental retardation, shortness of breath, choking  EXAM: CHEST  2 VIEW  COMPARISON:  12/07/2014  FINDINGS: Mild cardiomegaly with vascular congestion and basilar atelectasis. Low lung volumes. No focal pneumonia, collapse or consolidation. No edema, effusion or pneumothorax. Trachea midline. Atherosclerosis of the aorta. Gastric distention evident beneath the left hemidiaphragm.  IMPRESSION: Low volume exam with minor atelectasis  Mild cardiomegaly with vascular congestion  No significant interval change or superimposed acute process.   Electronically Signed   By: Judie Petit.  Shick M.D.   On: 12/17/2014 14:14   I have personally reviewed and evaluated these images and lab results as part of my medical decision-making.    MDM   Final diagnoses:  Choking        Nelva Nay, MD 12/17/14 [phone]

## 2014-12-17 NOTE — ED Notes (Signed)
Patient from [facility] assisted living who choked on a piece of chicken.  Nurses did chest compressions and patient coughed up a huge piece of chicken.  Patient alert and moving air throughout the incident.  Patient back to baseline, history of Down's syndrome.  According to EMS patient can comprehend but cannot verbally respond.  Uses gestures to communicate needs.  Lungs clear bilaterally. 02 sats 95% on room air.

## 2014-12-17 NOTE — Discharge Instructions (Signed)
Choking °Choking occurs when a food or object gets stuck in the throat or trachea, blocking the airway. If the airway is partly blocked, coughing will usually cause the food or object to come out. If the airway is completely blocked, immediate action is needed to help it come out. A complete airway blockage is life threatening because it causes breathing to stop. Choking is a true medical emergency that requires fast, appropriate action by anyone available. °SIGNS OF AIRWAY BLOCKAGE °There is a partial airway blockage if you or the person who is choking is:  °· Able to breathe and speak. °· Coughing loudly. °· Making loud noises. °There is a complete airway blockage if you or the person who is choking is:  °· Unable to breathe. °· Making soft or high-pitched sounds while breathing. °· Unable to cough or coughing weakly, ineffectively, or silently. °· Unable to cry, speak, or make sounds. °· Turning blue. °· Holding the neck with both arms. This is the universal sign of choking. °WHAT TO DO IF CHOKING OCCURS °If there is a partial airway blockage, allow coughing to clear the airway. Do not try to drink until the food or object comes out. If someone else has a partial airway blockage, do not interfere. Stay with him or her and watch for signs of complete airway blockage until the food or object comes out.  °If there is a complete airway blockage or if there is a partial airway blockage and the food or object does not come out, perform abdominal thrusts (also referred to as the Heimlich maneuver). Abdominal thrusts are used to create an artificial cough to try to clear the airway. Performing abdominal thrusts is part of a series of steps that should be done to help someone who is choking. Abdominal thrusts are usually done by someone else, but if you are alone, you can perform abdominal thrusts on yourself. Follow the procedure below that best fits your situation.  °IF SOMEONE ELSE IS CHOKING: °For a conscious adult:    °1. Ask the person whether he or she is choking. If the person nods, continue to step 2. °2. Stand or kneel behind the person and lean him or her forward slightly. °3. Make a fist with 1 hand, put your arms around the person, and grasp your fist with your other hand. Place the thumb side of your fist in the person's abdomen, just below the ribs. °4. Press inward and upward with both hands. °5. Repeat this maneuver until the object comes out and the person is able to breathe or until the person loses consciousness. °For an unconscious adult: °1. Shout for help. If someone responds, have him or her call local emergency services (911 in [place] If no one responds, call local emergency services yourself if possible. °2. Begin CPR, starting with compressions. Every time you open the airway to give rescue breaths, open the person's mouth. If you can see the food or object and it can be easily pulled out, remove it with your fingers. °3. After 5 cycles or 2 minutes of CPR, call local emergency services (911 in [place] if you or someone else did not already call. °For a conscious adult who is obese or in the later stages of pregnancy: °Abdominal thrusts may not be effective when helping people who are in the later stages of pregnancy or who are obese. In these instances, chest thrusts can be used.  °1. Ask the person whether he or she is choking. If the person   nods and has signs of complete airway blockage, continue to step 2. °2. Stand behind the person and wrap your arms around his or her chest (with your arms under the person's armpits). °3. Make a fist with 1 hand. Place the thumb side of your fist on the middle of the person's breastbone. °4. Grab your fist with your other hand and thrust backward. Continue this until the object comes out or until the person becomes unconscious. °For an unconscious adult who is obese or in the later stages of pregnancy:  °1. Shout for help. If someone responds, have him or her call local  emergency services (911 in [place] If no one responds, call local emergency services yourself if possible. °2. Begin CPR, starting with compressions. Every time you open the airway to give rescue breaths, open the person's mouth. If you can see the food or object and it can be easily pulled out, remove it with your fingers. °3. After 5 cycles or 2 minutes of CPR, call local emergency services (911 in [place] if you or someone else did not already call. °Note that abdominal thrusts (below the rib cage) should be used for a pregnant woman if possible. This should be possible until the later stages of pregnancy when there is no longer enough room between the enlarging uterus and the rib cage to perform the maneuver. At that point, chest thrusts must be used as described. °IF YOU ARE CHOKING: °1. Call local emergency services (911 in [place] if near a landline. Do not worry about communicating what is happening. Do not hang up the phone. Someone may be sent to help you anyway. °2. Make a fist with 1 hand. Put the thumb side of the fist against your stomach, just above the belly button and well below the breastbone. If you are pregnant or obese, put your fist on your chest instead, just below the breastbone and just above your lowest ribs. °3. Hold your fist with your other hand and bend over a hard surface, such as a table or chair. °4. Forcefully push your fist in and up. °5. Continue to do this until the food or object comes out. °PREVENTION  °To be prepared if choking occurs, learn how to correctly perform abdominal thrusts and give CPR by taking a certified first-aid training course.  °SEEK IMMEDIATE MEDICAL CARE IF: °· You have a fever after choking stops. °· You have problems breathing after choking stops. °· You received the Heimlich maneuver. °MAKE SURE YOU: °· Understand these instructions. °· Watch your condition. °· Get help right away if you are not doing well or get worse. °Document Released: 04/25/2004 Document  Revised: 08/02/2013 Document Reviewed: 10/29/2011 °ExitCare® Patient Information ©2015 [employer]. This information is not intended to replace advice given to you by your health care provider. Make sure you discuss any questions you have with your health care provider. ° °

## 2015-03-30 ENCOUNTER — Encounter (HOSPITAL_COMMUNITY): Payer: Self-pay

## 2015-03-30 ENCOUNTER — Inpatient Hospital Stay (HOSPITAL_COMMUNITY)
Admission: EM | Admit: 2015-03-30 | Discharge: 2015-04-11 | DRG: 871 | Disposition: A | Discharge status: Nursing Home (Includes ICF) | Payer: Medicare Other | Attending: Internal Medicine | Admitting: Internal Medicine

## 2015-03-30 ENCOUNTER — Emergency Department (HOSPITAL_COMMUNITY): Payer: Medicare Other

## 2015-03-30 DIAGNOSIS — Z515 Encounter for palliative care: Secondary | ICD-10-CM | POA: Diagnosis not present

## 2015-03-30 DIAGNOSIS — Y95 Nosocomial condition: Secondary | ICD-10-CM | POA: Diagnosis present

## 2015-03-30 DIAGNOSIS — A419 Sepsis, unspecified organism: Secondary | ICD-10-CM | POA: Diagnosis not present

## 2015-03-30 DIAGNOSIS — R0600 Dyspnea, unspecified: Secondary | ICD-10-CM | POA: Diagnosis not present

## 2015-03-30 DIAGNOSIS — G20A1 Parkinson's disease without dyskinesia, without mention of fluctuations: Secondary | ICD-10-CM | POA: Diagnosis present

## 2015-03-30 DIAGNOSIS — E870 Hyperosmolality and hypernatremia: Secondary | ICD-10-CM | POA: Diagnosis not present

## 2015-03-30 DIAGNOSIS — R6520 Severe sepsis without septic shock: Secondary | ICD-10-CM | POA: Diagnosis present

## 2015-03-30 DIAGNOSIS — B9620 Unspecified Escherichia coli [E. coli] as the cause of diseases classified elsewhere: Secondary | ICD-10-CM | POA: Diagnosis present

## 2015-03-30 DIAGNOSIS — J189 Pneumonia, unspecified organism: Secondary | ICD-10-CM | POA: Diagnosis present

## 2015-03-30 DIAGNOSIS — G20 Parkinson's disease: Secondary | ICD-10-CM | POA: Diagnosis present

## 2015-03-30 DIAGNOSIS — R0902 Hypoxemia: Secondary | ICD-10-CM | POA: Diagnosis present

## 2015-03-30 DIAGNOSIS — L8990 Pressure ulcer of unspecified site, unspecified stage: Secondary | ICD-10-CM | POA: Diagnosis present

## 2015-03-30 DIAGNOSIS — Z960 Presence of urogenital implants: Secondary | ICD-10-CM | POA: Diagnosis not present

## 2015-03-30 DIAGNOSIS — I10 Essential (primary) hypertension: Secondary | ICD-10-CM | POA: Diagnosis present

## 2015-03-30 DIAGNOSIS — M1990 Unspecified osteoarthritis, unspecified site: Secondary | ICD-10-CM | POA: Diagnosis present

## 2015-03-30 DIAGNOSIS — J690 Pneumonitis due to inhalation of food and vomit: Secondary | ICD-10-CM | POA: Diagnosis present

## 2015-03-30 DIAGNOSIS — F79 Unspecified intellectual disabilities: Secondary | ICD-10-CM

## 2015-03-30 DIAGNOSIS — L8991 Pressure ulcer of unspecified site, stage 1: Secondary | ICD-10-CM | POA: Diagnosis present

## 2015-03-30 DIAGNOSIS — E875 Hyperkalemia: Secondary | ICD-10-CM | POA: Diagnosis present

## 2015-03-30 DIAGNOSIS — N390 Urinary tract infection, site not specified: Secondary | ICD-10-CM | POA: Diagnosis present

## 2015-03-30 DIAGNOSIS — F919 Conduct disorder, unspecified: Secondary | ICD-10-CM | POA: Diagnosis present

## 2015-03-30 DIAGNOSIS — Z66 Do not resuscitate: Secondary | ICD-10-CM | POA: Diagnosis present

## 2015-03-30 DIAGNOSIS — N179 Acute kidney failure, unspecified: Secondary | ICD-10-CM | POA: Diagnosis present

## 2015-03-30 DIAGNOSIS — R509 Fever, unspecified: Secondary | ICD-10-CM

## 2015-03-30 DIAGNOSIS — R1310 Dysphagia, unspecified: Secondary | ICD-10-CM | POA: Diagnosis not present

## 2015-03-30 DIAGNOSIS — A4151 Sepsis due to Escherichia coli [E. coli]: Principal | ICD-10-CM | POA: Diagnosis present

## 2015-03-30 DIAGNOSIS — E785 Hyperlipidemia, unspecified: Secondary | ICD-10-CM | POA: Diagnosis present

## 2015-03-30 DIAGNOSIS — R4182 Altered mental status, unspecified: Secondary | ICD-10-CM | POA: Diagnosis not present

## 2015-03-30 DIAGNOSIS — R7881 Bacteremia: Secondary | ICD-10-CM | POA: Diagnosis not present

## 2015-03-30 DIAGNOSIS — Z23 Encounter for immunization: Secondary | ICD-10-CM | POA: Diagnosis not present

## 2015-03-30 DIAGNOSIS — D696 Thrombocytopenia, unspecified: Secondary | ICD-10-CM | POA: Diagnosis present

## 2015-03-30 DIAGNOSIS — R8290 Unspecified abnormal findings in urine: Secondary | ICD-10-CM | POA: Diagnosis not present

## 2015-03-30 HISTORY — DX: Restlessness and agitation: R45.1

## 2015-03-30 HISTORY — DX: Hyperlipidemia, unspecified: E78.5

## 2015-03-30 LAB — URINALYSIS, ROUTINE W REFLEX MICROSCOPIC
GLUCOSE, UA: NEGATIVE mg/dL
KETONES UR: NEGATIVE mg/dL
Nitrite: NEGATIVE
PH: 5 (ref 5.0–8.0)
Protein, ur: 100 mg/dL — AB
Specific Gravity, Urine: 1.019 (ref 1.005–1.030)

## 2015-03-30 LAB — PROCALCITONIN: Procalcitonin: 3.35 ng/mL

## 2015-03-30 LAB — CBC WITH DIFFERENTIAL/PLATELET
BASOS ABS: 0 10*3/uL (ref 0.0–0.1)
Basophils Relative: 0 %
Eosinophils Absolute: 0 10*3/uL (ref 0.0–0.7)
Eosinophils Relative: 0 %
HEMATOCRIT: 37.5 % — AB (ref 39.0–52.0)
Hemoglobin: 12.7 g/dL — ABNORMAL LOW (ref 13.0–17.0)
LYMPHS ABS: 0.2 10*3/uL — AB (ref 0.7–4.0)
LYMPHS PCT: 1 %
MCH: 32.3 pg (ref 26.0–34.0)
MCHC: 33.9 g/dL (ref 30.0–36.0)
MCV: 95.4 fL (ref 78.0–100.0)
MONO ABS: 0.8 10*3/uL (ref 0.1–1.0)
Monocytes Relative: 5 %
NEUTROS ABS: 14.4 10*3/uL — AB (ref 1.7–7.7)
Neutrophils Relative %: 94 %
Platelets: 159 10*3/uL (ref 150–400)
RBC: 3.93 MIL/uL — ABNORMAL LOW (ref 4.22–5.81)
RDW: 14 % (ref 11.5–15.5)
WBC: 15.5 10*3/uL — ABNORMAL HIGH (ref 4.0–10.5)

## 2015-03-30 LAB — COMPREHENSIVE METABOLIC PANEL
ALBUMIN: 2.7 g/dL — AB (ref 3.5–5.0)
ALT: 24 U/L (ref 17–63)
ANION GAP: 14 (ref 5–15)
AST: 35 U/L (ref 15–41)
Alkaline Phosphatase: 74 U/L (ref 38–126)
BILIRUBIN TOTAL: 0.7 mg/dL (ref 0.3–1.2)
BUN: 66 mg/dL — ABNORMAL HIGH (ref 6–20)
CHLORIDE: 97 mmol/L — AB (ref 101–111)
CO2: 26 mmol/L (ref 22–32)
Calcium: 8.2 mg/dL — ABNORMAL LOW (ref 8.9–10.3)
Creatinine, Ser: 3.16 mg/dL — ABNORMAL HIGH (ref 0.61–1.24)
GFR calc Af Amer: 21 mL/min — ABNORMAL LOW (ref >60)
GFR calc non Af Amer: 18 mL/min — ABNORMAL LOW (ref >60)
GLUCOSE: 118 mg/dL — AB (ref 65–99)
POTASSIUM: 5 mmol/L (ref 3.5–5.1)
SODIUM: 137 mmol/L (ref 135–145)
TOTAL PROTEIN: 6.1 g/dL — AB (ref 6.5–8.1)

## 2015-03-30 LAB — URINE MICROSCOPIC-ADD ON

## 2015-03-30 LAB — PROTIME-INR
INR: 1.24 (ref 0.00–1.49)
PROTHROMBIN TIME: 15.8 s — AB (ref 11.6–15.2)

## 2015-03-30 LAB — CBG MONITORING, ED: Glucose-Capillary: 104 mg/dL — ABNORMAL HIGH (ref 65–99)

## 2015-03-30 LAB — I-STAT CG4 LACTIC ACID, ED: Lactic Acid, Venous: 2.69 mmol/L (ref 0.5–2.0)

## 2015-03-30 LAB — STREP PNEUMONIAE URINARY ANTIGEN: Strep Pneumo Urinary Antigen: NEGATIVE

## 2015-03-30 LAB — APTT: aPTT: 33 seconds (ref 24–37)

## 2015-03-30 LAB — MRSA PCR SCREENING: MRSA by PCR: NEGATIVE

## 2015-03-30 MED ORDER — ESCITALOPRAM OXALATE 10 MG PO TABS
10.0000 mg | ORAL_TABLET | Freq: Every day | ORAL | Status: DC
  Administered 2015-03-31 – 2015-04-11 (×12): 10 mg via ORAL
  Filled 2015-03-30 (×12): qty 1

## 2015-03-30 MED ORDER — DIVALPROEX SODIUM 500 MG PO DR TAB
500.0000 mg | DELAYED_RELEASE_TABLET | Freq: Two times a day (BID) | ORAL | Status: DC
  Administered 2015-03-30 – 2015-04-11 (×24): 500 mg via ORAL
  Filled 2015-03-30 (×25): qty 1

## 2015-03-30 MED ORDER — SODIUM CHLORIDE 0.9 % IV BOLUS (SEPSIS)
2000.0000 mL | Freq: Once | INTRAVENOUS | Status: AC
  Administered 2015-03-30: 2000 mL via INTRAVENOUS

## 2015-03-30 MED ORDER — HYDROXYZINE HCL 25 MG PO TABS: 25.0000 mg | ORAL_TABLET | ORAL | Status: DC | PRN

## 2015-03-30 MED ORDER — LORAZEPAM 0.5 MG PO TABS
0.5000 mg | ORAL_TABLET | Freq: Two times a day (BID) | ORAL | Status: DC | PRN
  Administered 2015-04-03 – 2015-04-05 (×2): 0.5 mg via ORAL
  Filled 2015-03-30 (×2): qty 1

## 2015-03-30 MED ORDER — ATORVASTATIN CALCIUM 20 MG PO TABS
20.0000 mg | ORAL_TABLET | Freq: Every day | ORAL | Status: DC
  Administered 2015-03-30 – 2015-04-10 (×12): 20 mg via ORAL
  Filled 2015-03-30 (×2): qty 1
  Filled 2015-03-30: qty 2
  Filled 2015-03-30 (×5): qty 1
  Filled 2015-03-30 (×2): qty 2
  Filled 2015-03-30 (×3): qty 1

## 2015-03-30 MED ORDER — ETODOLAC 400 MG PO TABS
400.0000 mg | ORAL_TABLET | Freq: Two times a day (BID) | ORAL | Status: DC
  Administered 2015-03-30: 400 mg via ORAL
  Filled 2015-03-30 (×3): qty 1

## 2015-03-30 MED ORDER — QUETIAPINE FUMARATE 50 MG PO TABS
50.0000 mg | ORAL_TABLET | Freq: Two times a day (BID) | ORAL | Status: DC
  Administered 2015-03-30 – 2015-04-11 (×23): 50 mg via ORAL
  Filled 2015-03-30 (×25): qty 1

## 2015-03-30 MED ORDER — ENSURE ENLIVE PO LIQD
237.0000 mL | Freq: Three times a day (TID) | ORAL | Status: DC
  Administered 2015-03-31 – 2015-04-11 (×16): 237 mL via ORAL

## 2015-03-30 MED ORDER — SODIUM CHLORIDE 0.9 % IV SOLN
INTRAVENOUS | Status: DC
  Administered 2015-03-30 – 2015-04-01 (×3): via INTRAVENOUS

## 2015-03-30 MED ORDER — BENZTROPINE MESYLATE 0.5 MG PO TABS
0.5000 mg | ORAL_TABLET | Freq: Two times a day (BID) | ORAL | Status: DC
  Administered 2015-03-30 – 2015-04-11 (×24): 0.5 mg via ORAL
  Filled 2015-03-30 (×25): qty 1

## 2015-03-30 MED ORDER — ENOXAPARIN SODIUM 30 MG/0.3ML ~~LOC~~ SOLN
30.0000 mg | SUBCUTANEOUS | Status: DC
  Administered 2015-03-30 – 2015-04-03 (×5): 30 mg via SUBCUTANEOUS
  Filled 2015-03-30 (×5): qty 0.3

## 2015-03-30 MED ORDER — GUAIFENESIN-DM 100-10 MG/5ML PO SYRP
5.0000 mL | ORAL_SOLUTION | ORAL | Status: DC | PRN
  Filled 2015-03-30: qty 10

## 2015-03-30 MED ORDER — VANCOMYCIN HCL IN DEXTROSE 1-5 GM/200ML-% IV SOLN: 1000.0000 mg | INTRAVENOUS | Status: DC

## 2015-03-30 MED ORDER — DEXTROSE 5 % IV SOLN
1.0000 g | Freq: Once | INTRAVENOUS | Status: AC
  Administered 2015-03-30: 1 g via INTRAVENOUS
  Filled 2015-03-30: qty 1

## 2015-03-30 MED ORDER — DEXTROSE 5 % IV SOLN
1.0000 g | INTRAVENOUS | Status: DC
  Administered 2015-03-31 – 2015-04-02 (×3): 1 g via INTRAVENOUS
  Filled 2015-03-30 (×5): qty 1

## 2015-03-30 MED ORDER — CHLORHEXIDINE GLUCONATE 0.12 % MT SOLN
15.0000 mL | Freq: Two times a day (BID) | OROMUCOSAL | Status: DC
  Administered 2015-03-30 – 2015-04-11 (×18): 15 mL via OROMUCOSAL
  Filled 2015-03-30 (×23): qty 15

## 2015-03-30 MED ORDER — OMEGA-3-ACID ETHYL ESTERS 1 G PO CAPS
2.0000 g | ORAL_CAPSULE | Freq: Two times a day (BID) | ORAL | Status: DC
  Administered 2015-03-30 – 2015-04-11 (×20): 2 g via ORAL
  Filled 2015-03-30 (×25): qty 2

## 2015-03-30 MED ORDER — ACETAMINOPHEN 325 MG PO TABS
650.0000 mg | ORAL_TABLET | ORAL | Status: DC | PRN
  Administered 2015-04-01 – 2015-04-10 (×7): 650 mg via ORAL
  Filled 2015-03-30 (×8): qty 2

## 2015-03-30 MED ORDER — VANCOMYCIN HCL IN DEXTROSE 1-5 GM/200ML-% IV SOLN
1000.0000 mg | INTRAVENOUS | Status: AC
  Administered 2015-03-30: 1000 mg via INTRAVENOUS
  Filled 2015-03-30: qty 200

## 2015-03-30 NOTE — Progress Notes (Signed)
Utilization Review completed.  Mikyla Schachter RN CM  

## 2015-03-30 NOTE — Progress Notes (Signed)
ANTIBIOTIC CONSULT NOTE - INITIAL  Pharmacy Consult for vancomycin/Cefepime Indication: rule out sepsis  No Known Allergies  Patient Measurements: Height: 5\' 5"  (165.1 cm) Weight: 150 lb (68.04 kg) IBW/kg (Calculated) : 61.5 Adjusted Body Weight:   Vital Signs: Temp: 100 F (37.8 C) (12/29 1312) Temp Source: Rectal (12/29 1312) BP: 112/75 mmHg (12/29 1430) Pulse Rate: 66 (12/29 1430) Intake/Output from previous day:   Intake/Output from this shift:    Labs:  Recent Labs  03/30/15 1342  WBC 15.5*  HGB 12.7*  PLT 159   CrCl cannot be calculated (Patient has no serum creatinine result on file.). No results for input(s): VANCOTROUGH, VANCOPEAK, VANCORANDOM, GENTTROUGH, GENTPEAK, GENTRANDOM, TOBRATROUGH, TOBRAPEAK, TOBRARND, AMIKACINPEAK, AMIKACINTROU, AMIKACIN in the last 72 hours.   Microbiology: No results found for this or any previous visit (from the past 720 hour(s)).  Medical History: Past Medical History  Diagnosis Date  . Mental retardation   . Hypertension   . Parkinson's disease (HCC)   . Osteoarthritis    Assessment: [age] YOM presents with lethargy, code sepsis initiated.  Pharmacy asked to dose vancomycin, cefepime x1 per EDP.  AKI noted at admission, lactic acid and WBC increased.   12/29 >>vancomycin >> 12/29 >> cefepime >>  12/29 blood: 12/29 urine:  / sputum:  Dose changes/levels:  Goal of Therapy:  Vancomycin trough level 15-20 mcg/ml  Plan:   Vancomycin 1gm x 1 then 1gm x 1 12/30 then 1gm q48h  Follow for changes in renal function  Check levels as necessary to monitor drug therapy  Cefepime 1gm q24h  Juliette Alcideustin Davin Muramoto, PharmD, BCPS.   Pager: [phone] 03/30/2015 2:48 PM

## 2015-03-30 NOTE — ED Notes (Signed)
Bed: [id number] Expected date:  Expected time:  Means of arrival:  Comments: Ems- lethargic

## 2015-03-30 NOTE — H&P (Signed)
History and Physical:    Gary [name]   RUE:[id number] DOB: 1941/01/08 DOA: 03/30/2015  Referring MD/provider: Dr. Radford Pax PCP: Cheral Bay, MD   Chief Complaint: AMS  History of Present Illness:   Gary [name] is an 74 y.o. male with a PMH of mental retardation, from [facility], who was sent to the ED for evaluation of altered mental status, hypoxia and low BP. The patient is unable to provide any additional information, but denies shortness of breath or pain. No family is with him, and no notes were sent with him regarding the reason he was sent here.  Attempts to reach his primary contact were not successful.  According to an RN at his facility, he was in his usual state of health until this morning, when he appeared to be and unable to feed himself, he subsequently had a coughing spell while eating resulting in respiratory distress which prompted her to call EMS.  He has a nephew, Clydie Braun, who visits him.  I am unable to reach him by telephone. Upon initial evaluation in the ED, he was found to be febrile, hypotensive, and with AKI. CXR shows pneumonia.  ROS:   Review of Systems  Unable to perform ROS: mental acuity    Past Medical History:   Past Medical History  Diagnosis Date  . Mental retardation   . Hypertension   . Parkinson's disease (HCC)   . Osteoarthritis   . Hyperlipidemia   . Agitation     Past Surgical History:   History reviewed. No pertinent past surgical history.  Social History:   Social History   Social History  . Marital Status: Single    Spouse Name: N/A  . Number of Children: N/A  . Years of Education: N/A   Occupational History  . Not on file.   Social History Main Topics  . Smoking status: Never Smoker   . Smokeless tobacco: Not on file  . Alcohol Use: No  . Drug Use: Not on file  . Sexual Activity: Not on file   Other Topics Concern  . Not on file   Social History Narrative    Family  history:   No family history on file.  Allergies   Review of patient's allergies indicates no known allergies.  Current Medications:   Prior to Admission medications   Medication Sig Start Date End Date Taking? Authorizing Provider  acetaminophen (TYLENOL) 500 MG tablet Take 1,000 mg by mouth every 12 (twelve) hours as needed (for pain).   Yes Historical Provider, MD  atorvastatin (LIPITOR) 20 MG tablet Take 20 mg by mouth at bedtime.   Yes Historical Provider, MD  benztropine (COGENTIN) 0.5 MG tablet Take 0.5 mg by mouth 2 (two) times daily.   Yes Historical Provider, MD  chlorhexidine (PERIDEX) 0.12 % solution Use as directed 15 mLs in the mouth or throat 2 (two) times daily.   Yes Historical Provider, MD  divalproex (DEPAKOTE) 500 MG DR tablet Take 500 mg by mouth 2 (two) times daily.   Yes Historical Provider, MD  ENSURE (ENSURE) Take 237 mLs by mouth 3 (three) times daily between meals.   Yes Historical Provider, MD  escitalopram (LEXAPRO) 10 MG tablet Take 10 mg by mouth daily.   Yes Historical Provider, MD  etodolac (LODINE) 400 MG tablet Take 400 mg by mouth 2 (two) times daily.    Yes Historical Provider, MD  fish oil-omega-3 fatty acids 1000 MG capsule Take 2 g by  mouth 2 (two) times daily.   Yes Historical Provider, MD  hydrOXYzine (ATARAX/VISTARIL) 25 MG tablet Take 25 mg by mouth every 4 (four) hours as needed for itching.   Yes Historical Provider, MD  LORazepam (ATIVAN) 0.5 MG tablet Take 0.5 mg by mouth 2 (two) times daily.   Yes Historical Provider, MD  LORazepam (ATIVAN) 0.5 MG tablet Take 0.5 mg by mouth every 12 (twelve) hours as needed (for agitation).   Yes Historical Provider, MD  QUEtiapine (SEROQUEL) 50 MG tablet Take 50 mg by mouth 2 (two) times daily.   Yes Historical Provider, MD  triamcinolone (KENALOG) 0.025 % cream Apply 1 application topically every 6 (six) hours as needed (for itching).    Yes Historical Provider, MD  ibuprofen (ADVIL,MOTRIN) 800 MG tablet  Take 1 tablet (800 mg total) by mouth every 8 (eight) hours as needed. Patient not taking: Reported on 12/17/2014 10/16/14   Charlestine Night, PA-C  perphenazine (TRILAFON) 4 MG tablet Take 4 mg by mouth daily after breakfast. Reported on 03/30/2015    Historical Provider, MD    Physical Exam:   Filed Vitals:   03/30/15 1345 03/30/15 1403 03/30/15 1428 03/30/15 1430  BP: 95/65 107/49  112/75  Pulse:  69 68 66  Temp:      TempSrc:      Resp: Height:      Weight:      SpO2:  96% 95% 89%     Physical Exam: Blood pressure 112/75, pulse 66, temperature 100 F (37.8 C), temperature source Rectal, resp. rate 19, height  (1.651 m), weight 68.04 kg (150 lb), SpO2 89 %. Gen: No acute distress. Somnolent. Head: Normocephalic, atraumatic. Eyes: PERRL, EOMI, sclerae nonicteric. Mouth: Oropharynx with dry mucous membranes. Neck: Supple, no thyromegaly, no lymphadenopathy, no jugular venous distention. Chest: Lungs diminished but clear. CV: Heart sounds are regular.  No M/R/G. Abdomen: Soft, nontender, nondistended with normal active bowel sounds. Extremities: Extremities are without C/E/C. Skin: Warm and dry. Neuro: Lethargic, answers simple questions. Psych: Mood and affect normal.   Data Review:    Labs: Basic Metabolic Panel:  Recent Labs Lab 03/30/15 1342  NA 137  K 5.0  CL 97*  CO2 26  GLUCOSE 118*  BUN 66*  CREATININE 3.16*  CALCIUM 8.2*   Liver Function Tests:  Recent Labs Lab 03/30/15 1342  AST 35  ALT 24  ALKPHOS 74  BILITOT 0.7  PROT 6.1*  ALBUMIN 2.7*   CBC:  Recent Labs Lab 03/30/15 1342  WBC 15.5*  NEUTROABS 14.4*  HGB 12.7*  HCT 37.5*  MCV 95.4  PLT 159   CBG:  Recent Labs Lab 03/30/15 1316  GLUCAP 104*    Radiographic Studies: Dg Chest 2 View  03/30/2015  CLINICAL DATA:  Altered mental status.  Abnormal lung exam. EXAM: CHEST  2 VIEW COMPARISON:  12/17/2014 and 12/07/2014 FINDINGS: Artifact overlies chest.  Heart size is normal. Mediastinal shadows are normal. There is patchy density in both lower lobes consistent with atelectasis and basilar pneumonia. Upper lungs are clear. No effusions. No acute bone finding. IMPRESSION: Abnormal patchy density in both lower lobes consistent with pneumonia. Electronically Signed   By: Paulina Fusi M.D.   On: 03/30/2015 13:42   *I have personally reviewed the images above*  EKG: Independently reviewed. NSR at 80 bpm, no ischemic changes.   Assessment/Plan:   Principal Problem:   Sepsis (HCC) secondary to HCAP - This patient is at high risk  of poor outcomes with a SOFA score of 2-3. Admit to SDU. - Source thought to be from lungs with pneumonia findings on CXR.  U/A shows turbid urine with moderate WBC. - Send blood, urine and sputum cultures. - WBC is 15.5, lactic acid is 2.69.  Check pro-calcitoninin. - Fluid volume resuscitate with 30 mg/kg using weight based algorithm per sepsis order set (3 Liters ordered). - Start targeted antibiotics with Cefepime/Vancomycin, based on suspected source of infection & from SNF.  Active Problems:   AKI - Secondary to sepsis.  Hydrate and monitor.  Baseline creatinine is 0.9.    Mental retardation with behavioral disturbance - Continue Depakote, Lexapro, Ativan PRN and Seroquel.    Hyperlipidemia - Continue statin/fish oil.    Parkinson's disease (HCC) - Unclear dx.  Not on meds for this. PT eval when stable.  DVT prophylaxis - Lovenox ordered.  Code Status / Family Communication / Disposition Plan:   Code Status: Full. Family Communication: [facility] (nephew, Clydie BraunMerrell Dowdy [phone]) [facility] Plan[facility], likely 3-4 days.  Attestation regarding necessity of inpatient status:   The appropriate admission status for this patient is INPATIENT. Inpatient status is judged to be reasonable and necessary in order to provide the required intensity of service  to ensure the patient's safety. The patient's presenting symptoms, physical exam findings, and initial radiographic and laboratory data in the context of their chronic comorbidities is felt to place them at high risk for further clinical deterioration. Furthermore, it is not anticipated that the patient will be medically stable for discharge from the hospital within 2 midnights of admission. The following factors support the admission status of inpatient.   -The patient's presenting symptoms include AMS, cough, respiratory distress. - The worrisome physical exam findings include AMS, dry mucous membranes, fever. - The initial radiographic and laboratory data are worrisome because of AKI, leukocytosis, Pneumonia findings on CXR. - The chronic co-morbidities include MR. - Patient requires inpatient status due to high intensity of service, high risk for further deterioration and high frequency of surveillance required. - I certify that at the point of admission it is my clinical judgment that the patient will require inpatient hospital care spanning beyond 2 midnights from the point of admission.   Time spent: 1 hour.  RAMA,CHRISTINA [facility] Pager [phone] Cell: [phone]   If 7PM-7AM, please contact night-coverage [email or web address] Password [email or web address] 03/30/2015, 3:46 PM

## 2015-03-30 NOTE — ED Notes (Signed)
Patient transported to X-ray 

## 2015-03-30 NOTE — ED Notes (Signed)
Pt presents with altered mental status. Low oxygen, low BP

## 2015-03-30 NOTE — Progress Notes (Signed)
CSW attempted to speak with patient at bedside. However, the patient was asleep and has nasal cannula for oxygen.  CSW will attempt to speak with patient later.  Trish MageBrittney Rayne Cowdrey, LCSWA [phone] ED CSW 03/30/2015 4:30 PM

## 2015-03-30 NOTE — ED Notes (Signed)
MD notified by previous RN regarding code sepsis.  Lactic elevated. MD aware.  Antibiotic order requested by RN

## 2015-03-30 NOTE — ED Provider Notes (Signed)
CSN: [id number]     Arrival date & time 03/30/15  1254 History   First MD Initiated Contact with Patient 03/30/15 1348     Chief Complaint  Patient presents with  . Altered Mental Status      Patient is a 74 y.o. male presenting with altered mental status.  Altered Mental Status  Patient was brought in from nursing home facility.  Has history of mental retardation since birth.  Presents with change in mental status and found by EMS to have low oxygen level.  Patient also has had low blood pressure. Past Medical History  Diagnosis Date  . Mental retardation   . Hypertension   . Parkinson's disease (HCC)   . Osteoarthritis    History reviewed. No pertinent past surgical history. No family history on file. Social History  Substance Use Topics  . Smoking status: Never Smoker   . Smokeless tobacco: None  . Alcohol Use: No    Review of Systems  Unable to perform ROS: Acuity of condition      Allergies  Review of patient's allergies indicates no known allergies.  Home Medications   Prior to Admission medications   Medication Sig Start Date End Date Taking? Authorizing Provider  acetaminophen (TYLENOL) 500 MG tablet Take 1,000 mg by mouth every 12 (twelve) hours as needed (for pain).   Yes Historical Provider, MD  atorvastatin (LIPITOR) 20 MG tablet Take 20 mg by mouth at bedtime.   Yes Historical Provider, MD  benztropine (COGENTIN) 0.5 MG tablet Take 0.5 mg by mouth 2 (two) times daily.   Yes Historical Provider, MD  chlorhexidine (PERIDEX) 0.12 % solution Use as directed 15 mLs in the mouth or throat 2 (two) times daily.   Yes Historical Provider, MD  divalproex (DEPAKOTE) 500 MG DR tablet Take 500 mg by mouth 2 (two) times daily.   Yes Historical Provider, MD  ENSURE (ENSURE) Take 237 mLs by mouth 3 (three) times daily between meals.   Yes Historical Provider, MD  escitalopram (LEXAPRO) 10 MG tablet Take 10 mg by mouth daily.   Yes Historical Provider, MD  etodolac  (LODINE) 400 MG tablet Take 400 mg by mouth 2 (two) times daily.    Yes Historical Provider, MD  fish oil-omega-3 fatty acids 1000 MG capsule Take 2 g by mouth 2 (two) times daily.   Yes Historical Provider, MD  hydrOXYzine (ATARAX/VISTARIL) 25 MG tablet Take 25 mg by mouth every 4 (four) hours as needed for itching.   Yes Historical Provider, MD  LORazepam (ATIVAN) 0.5 MG tablet Take 0.5 mg by mouth 2 (two) times daily.   Yes Historical Provider, MD  LORazepam (ATIVAN) 0.5 MG tablet Take 0.5 mg by mouth every 12 (twelve) hours as needed (for agitation).   Yes Historical Provider, MD  QUEtiapine (SEROQUEL) 50 MG tablet Take 50 mg by mouth 2 (two) times daily.   Yes Historical Provider, MD  triamcinolone (KENALOG) 0.025 % cream Apply 1 application topically every 6 (six) hours as needed (for itching).    Yes Historical Provider, MD  ibuprofen (ADVIL,MOTRIN) 800 MG tablet Take 1 tablet (800 mg total) by mouth every 8 (eight) hours as needed. Patient not taking: Reported on 12/17/2014 10/16/14   Charlestine Night, PA-C  perphenazine (TRILAFON) 4 MG tablet Take 4 mg by mouth daily after breakfast. Reported on 03/30/2015    Historical Provider, MD   BP 112/75 mmHg  Pulse 66  Temp(Src) 100 F (37.8 C) (Rectal)  Resp 19  Ht   (1.651 m)  Wt 150 lb (68.04 kg)  BMI 24.96 kg/m2  SpO2 89% Physical Exam Physical Exam  Nursing note and vitals reviewed. Constitutional: Patient is not communicative.  Has mild to moderate respiratory distress. HENT:  Head: Normocephalic and atraumatic.  Eyes: Pupils are equal, round, and reactive to light.  Neck: Normal range of motion.  Cardiovascular: Normal rate and intact distal pulses.   Pulmonary/Chest: Scattered rhonchi in all lung fields.   Abdominal: Normal appearance. He exhibits no distension.  Musculoskeletal: Normal range of motion.  Neurological: Patient moves all 4 extremities..  Skin: Skin is warm and dry. No rash noted.    ED Course   Procedures (including critical care time) Medications  sodium chloride 0.9 % bolus 2,000 mL (2,000 mLs Intravenous New Bag/Given 03/30/15 1350)  ceFEPIme (MAXIPIME) 1 g in dextrose 5 % 50 mL IVPB (1 g Intravenous New Bag/Given 03/30/15 1448)  vancomycin (VANCOCIN) IVPB 1000 mg/200 mL premix (not administered)   CRITICAL CARE Performed by: Nelva Nay L Total critical care time: 30 minutes Critical care time was exclusive of separately billable procedures and treating other patients. Critical care was necessary to treat or prevent imminent or life-threatening deterioration. Critical care was time spent personally by me on the following activities: development of treatment plan with patient and/or surrogate as well as nursing, discussions with consultants, evaluation of patient's response to treatment, examination of patient, obtaining history from patient or surrogate, ordering and performing treatments and interventions, ordering and review of laboratory studies, ordering and review of radiographic studies, pulse oximetry and re-evaluation of patient's condition.   Labs Review Labs Reviewed  COMPREHENSIVE METABOLIC PANEL - Abnormal; Notable for the following:    Chloride 97 (*)    Glucose, Bld 118 (*)    BUN 66 (*)    Creatinine, Ser 3.16 (*)    Calcium 8.2 (*)    Total Protein 6.1 (*)    Albumin 2.7 (*)    GFR calc non Af Amer 18 (*)    GFR calc Af Amer 21 (*)    All other components within normal limits  CBC WITH DIFFERENTIAL/PLATELET - Abnormal; Notable for the following:    WBC 15.5 (*)    RBC 3.93 (*)    Hemoglobin 12.7 (*)    HCT 37.5 (*)    Neutro Abs 14.4 (*)    Lymphs Abs 0.2 (*)    All other components within normal limits  URINALYSIS, ROUTINE W REFLEX MICROSCOPIC (NOT AT [facility]) - Abnormal; Notable for the following:    Color, Urine AMBER (*)    APPearance TURBID (*)    Hgb urine dipstick LARGE (*)    Bilirubin Urine LARGE (*)    Protein, ur 100 (*)     Leukocytes, UA MODERATE (*)    All other components within normal limits  URINE MICROSCOPIC-ADD ON - Abnormal; Notable for the following:    Squamous Epithelial / LPF 6-30 (*)    Bacteria, UA MANY (*)    Casts GRANULAR CAST (*)    All other components within normal limits  I-STAT CG4 LACTIC ACID, ED - Abnormal; Notable for the following:    Lactic Acid, Venous 2.69 (*)    All other components within normal limits  CBG MONITORING, ED - Abnormal; Notable for the following:    Glucose-Capillary 104 (*)    All other components within normal limits  CULTURE, BLOOD (ROUTINE X 2)  CULTURE, BLOOD (ROUTINE X 2)  URINE CULTURE  Imaging Review Dg Chest 2 View  03/30/2015  CLINICAL DATA:  Altered mental status.  Abnormal lung exam. EXAM: CHEST  2 VIEW COMPARISON:  12/17/2014 and 12/07/2014 FINDINGS: Artifact overlies chest. Heart size is normal. Mediastinal shadows are normal. There is patchy density in both lower lobes consistent with atelectasis and basilar pneumonia. Upper lungs are clear. No effusions. No acute bone finding. IMPRESSION: Abnormal patchy density in both lower lobes consistent with pneumonia. Electronically Signed   By: Paulina FusiMark  Shogry M.D.   On: 03/30/2015 13:42   I have personally reviewed and evaluated these images and lab results as part of my medical decision-making.   EKG Interpretation   Date/Time:  Thursday March 30 2015 13:06:31 EST Ventricular Rate:  80 PR Interval:  154 QRS Duration: 96 QT Interval:  361 QTC Calculation: 416 R Axis:   21 Text Interpretation:  Sinus rhythm Baseline wander in lead(s) V4 Confirmed  by Estus Krakowski  MD, Laquasha Groome ([id number]) on 03/30/2015 2:49:56 PM      MDM   Final diagnoses:  Hospital-acquired pneumonia        Nelva Nayobert Elizandro Laura, MD 03/30/15 1514

## 2015-03-31 ENCOUNTER — Encounter (HOSPITAL_COMMUNITY): Payer: Self-pay | Admitting: Internal Medicine

## 2015-03-31 DIAGNOSIS — L8990 Pressure ulcer of unspecified site, unspecified stage: Secondary | ICD-10-CM

## 2015-03-31 DIAGNOSIS — B9620 Unspecified Escherichia coli [E. coli] as the cause of diseases classified elsewhere: Secondary | ICD-10-CM | POA: Diagnosis present

## 2015-03-31 DIAGNOSIS — R7881 Bacteremia: Secondary | ICD-10-CM | POA: Diagnosis present

## 2015-03-31 LAB — BASIC METABOLIC PANEL
ANION GAP: 8 (ref 5–15)
BUN: 71 mg/dL — AB (ref 6–20)
CO2: 27 mmol/L (ref 22–32)
Calcium: 7.6 mg/dL — ABNORMAL LOW (ref 8.9–10.3)
Chloride: 106 mmol/L (ref 101–111)
Creatinine, Ser: 3.03 mg/dL — ABNORMAL HIGH (ref 0.61–1.24)
GFR, EST AFRICAN AMERICAN: 22 mL/min — AB (ref >60)
GFR, EST NON AFRICAN AMERICAN: 19 mL/min — AB (ref >60)
Glucose, Bld: 86 mg/dL (ref 65–99)
POTASSIUM: 5.6 mmol/L — AB (ref 3.5–5.1)
SODIUM: 141 mmol/L (ref 135–145)

## 2015-03-31 LAB — POTASSIUM: Potassium: 4.8 mmol/L (ref 3.5–5.1)

## 2015-03-31 LAB — CBC
HEMATOCRIT: 35 % — AB (ref 39.0–52.0)
Hemoglobin: 11.3 g/dL — ABNORMAL LOW (ref 13.0–17.0)
MCH: 31.9 pg (ref 26.0–34.0)
MCHC: 32.3 g/dL (ref 30.0–36.0)
MCV: 98.9 fL (ref 78.0–100.0)
Platelets: 155 10*3/uL (ref 150–400)
RBC: 3.54 MIL/uL — ABNORMAL LOW (ref 4.22–5.81)
RDW: 14.6 % (ref 11.5–15.5)
WBC: 16.5 10*3/uL — AB (ref 4.0–10.5)

## 2015-03-31 LAB — LACTIC ACID, PLASMA: Lactic Acid, Venous: 1.4 mmol/L (ref 0.5–2.0)

## 2015-03-31 LAB — VALPROIC ACID LEVEL: VALPROIC ACID LVL: 53 ug/mL (ref 50.0–100.0)

## 2015-03-31 MED ORDER — PNEUMOCOCCAL VAC POLYVALENT 25 MCG/0.5ML IJ INJ
0.5000 mL | INJECTION | INTRAMUSCULAR | Status: DC
  Filled 2015-03-31 (×2): qty 0.5

## 2015-03-31 MED ORDER — SODIUM CHLORIDE 0.9 % IV BOLUS (SEPSIS)
1000.0000 mL | Freq: Once | INTRAVENOUS | Status: AC
  Administered 2015-03-31: 1000 mL via INTRAVENOUS

## 2015-03-31 NOTE — Progress Notes (Signed)
CRITICAL VALUE ALERT  Critical value received:  Positive blood cultures (gram - rods in anaerobic/aerobic bottles in both sets  Date of notification:  03/31/2015  Time of notification:  0730  Critical value read back:Yes.    Nurse who received alert:  Santiago GladAllison Zayvier Caravello, RN  MD notified (1st page):  Dr. Darnelle Catalanama   Time of first page:  [phone]

## 2015-03-31 NOTE — Progress Notes (Signed)
Initial Nutrition Assessment  DOCUMENTATION CODES:   Not applicable  INTERVENTION:  -Ensure Enlive po BID, each supplement provides 350 kcal and 20 grams of protein  NUTRITION DIAGNOSIS:   Inadequate oral intake related to poor appetite as evidenced by per patient/family report.  GOAL:   Patient will meet greater than or equal to 90% of their needs  MONITOR:   PO intake, Labs, I & O's, Skin  REASON FOR ASSESSMENT:   Malnutrition Screening Tool    ASSESSMENT:   Gary [name] is an 74 y.o. male with a PMH of mental retardation, from [facility], who was sent to the ED for evaluation of altered mental status, hypoxia and low BP. The patient is unable to provide any additional information, but denies shortness of breath or pain. No family is with him, and no notes were sent with him regarding the reason he was sent here. Attempts to reach his primary contact were not successful. According to an RN at his facility, he was in his usual state of health until this morning, when he appeared to be and unable to feed himself, he subsequently had a coughing spell while eating resulting in respiratory distress which prompted her to call EMS  Spoke w/ pt's nephew/trustee at bedside. He reports pt had a short span of depressed appetite, but normally eats fine. During visit pt was receiving ensure and liquids from nurse. Pt drank about half of the ensure. Pt does not exhibit any signs of malnutrition besides slight scooping of temples.  Nephew did mention pt was on SOFT diet at SNF to prevent choking/aspiration - will make sure he is on that here as well.  Labs: BUN 71, CBGs 104, Cr 3.03, EGFR 19 Medications reviewed. Diet Order:  Diet full liquid Room service appropriate?: Yes; Fluid consistency:: Thin  Skin:  Wound (see comment) (Stg I PU to Sacrum)  Last BM:  PTA  Height:   Ht Readings from Last 1 Encounters:  03/30/15 '5\' 5"'  (1.651 m)    Weight:   Wt Readings  from Last 1 Encounters:  03/31/15 137 lb 9.1 oz (62.4 kg)    Ideal Body Weight:  61.81 kg  BMI:  Body mass index is 22.89 kg/(m^2).  Estimated Nutritional Needs:   Kcal:  1800-2100  Protein:  65-80 grams  Fluid:  >/= 1.8L  EDUCATION NEEDS:   No education needs identified at this time  [name]. [name], MS, RD LDN After Hours/Weekend Pager [phone]

## 2015-03-31 NOTE — Progress Notes (Signed)
Progress Note   Gary KidneyRoy Clair Leverette AVW:[id number] DOB: 07/22/1940 DOA: 03/30/2015 PCP: Cheral BayHAWKS,ALDENE N, MD   Brief Narrative:   Gary KidneyRoy Clair [name] is an 74 y.o. male with a PMH of mental retardation, [facility], who was sent to the ED for evaluation of altered mental status, hypoxia and low BP. Upon initial evaluation in the ED, he was found to be febrile, hypotensive, and with AKI. CXR shows pneumonia.  Assessment/Plan:   Principal Problem:  Sepsis (HCC) secondary to HCAP - This patient felt to be at high risk of poor outcomes with a SOFA score of 2-3. Admitted to SDU. - Source thought to be from lungs with pneumonia findings on CXR. U/A shows turbid urine with moderate WBC. - F/U blood, urine and sputum cultures. Preliminary cultures growing GNR in both bottles. - Pro calcitonin and lactic acid both elevated on admission. Lactate has cleared. - Fluid volume resuscitated with 30 mg/kg using weight based algorithm per sepsis order set (3 Liters given). - Narrow antibiotics to Cefepime given GNR bacteremia. - Will give a 1 L bolus now, given that he still appears dry clinically and still has elevated creatinine over baseline values.  Active Problems:   Hyperkalemia - Possibly a hemolyzed specimen versus related to acute kidney injury. WNL on recheck.   AKI - Secondary to sepsis. Hydrate and monitor. Baseline creatinine is 0.9. - Continue vigorous IV fluids, no significant improvement in renal function yet.   Mental retardation with behavioral disturbance - Continue Depakote, Lexapro, Ativan PRN and Seroquel. - Depakote level therapeutic.   Hyperlipidemia - Continue statin/fish oil.   Parkinson's disease (HCC) - Unclear dx. Not on meds for this. PT eval when stable.    Pressure ulcer - Present on admission. Consult wound care nurse for evaluation and recommendations for treatment.    DVT Prophylaxis - Lovenox ordered.   Family  Communication/Anticipated D/C date and plan/Code Status   Family Communication: Nephew at bedside. [facility] Anticipated D/C date:   Likely 3 more days, one sepsis resolved and culture data back. Code Status: Full code.   IV Access:    Peripheral IV   Procedures and diagnostic studies:   Dg Chest 2 View  03/30/2015  CLINICAL DATA:  Altered mental status.  Abnormal lung exam. EXAM: CHEST  2 VIEW COMPARISON:  12/17/2014 and 12/07/2014 FINDINGS: Artifact overlies chest. Heart size is normal. Mediastinal shadows are normal. There is patchy density in both lower lobes consistent with atelectasis and basilar pneumonia. Upper lungs are clear. No effusions. No acute bone finding. IMPRESSION: Abnormal patchy density in both lower lobes consistent with pneumonia. Electronically Signed   By: Paulina FusiMark  Shogry M.D.   On: 03/30/2015 13:42     Medical Consultants:    None.  Anti-Infectives:    Vancomycin 03/30/15--->  Cefepime 03/30/15--->   Subjective:   Gary [name] is still a bit lethargic.  No N/V/D over night.  No dyspnea.  Not coughing.  Objective:    Filed Vitals:   03/31/15 0900 03/31/15 1000 03/31/15 1100 03/31/15 1200  BP: 102/42 84/41 108/46 108/69  Pulse: 69 65 72 78  Temp:    98.4 F (36.9 C)  TempSrc:    Oral  Resp: 23 19 19 26   Height:      Weight:      SpO2: 100% 96% 93% 94%    Intake/Output Summary (Last 24 hours) at 03/31/15 1356 Last data filed at 03/31/15 1200  Gross per 24 hour  Intake   3915 ml  Output    200 ml  Net   3715 ml   Filed Weights   03/30/15 1327 03/31/15 0400  Weight: 68.04 kg (150 lb) 62.4 kg (137 lb 9.1 oz)    Exam: Gen:  NAD, still with dry mucous membranes Cardiovascular:  RRR, No M/R/G Respiratory:  Lungs diminished Gastrointestinal:  Abdomen soft, NT/ND, + BS Extremities:  No C/E/C   Data Reviewed:    Labs: Basic Metabolic Panel:  Recent Labs Lab 03/30/15 1342  03/31/15 0355 03/31/15 0755  NA 137 141  --   K 5.0 5.6* 4.8  CL 97* 106  --   CO2 26 27  --   GLUCOSE 118* 86  --   BUN 66* 71*  --   CREATININE 3.16* 3.03*  --   CALCIUM 8.2* 7.6*  --    GFR Estimated Creatinine Clearance: 19.2 mL/min (by C-G formula based on Cr of 3.03). Liver Function Tests:  Recent Labs Lab 03/30/15 1342  AST 35  ALT 24  ALKPHOS 74  BILITOT 0.7  PROT 6.1*  ALBUMIN 2.7*   Coagulation profile  Recent Labs Lab 03/30/15 1330  INR 1.24    CBC:  Recent Labs Lab 03/30/15 1342 03/31/15 0355  WBC 15.5* 16.5*  NEUTROABS 14.4*  --   HGB 12.7* 11.3*  HCT 37.5* 35.0*  MCV 95.4 98.9  PLT 159 155   CBG:  Recent Labs Lab 03/30/15 1316  GLUCAP 104*   Sepsis Labs:  Recent Labs Lab 03/30/15 1342 03/30/15 1401 03/30/15 1812 03/31/15 0355 03/31/15 0755  PROCALCITON  --   --  3.35  --   --   WBC 15.5*  --   --  16.5*  --   LATICACIDVEN  --  2.69*  --   --  1.4   Microbiology Recent Results (from the past 240 hour(s))  Urine culture     Status: None (Preliminary result)   Collection Time: 03/30/15  1:31 PM  Result Value Ref Range Status   Specimen Description URINE, CATHETERIZED  Final   Special Requests NONE  Final   Culture   Final    >=100,000 COLONIES/mL ESCHERICHIA COLI Performed at [facility]    Report Status PENDING  Incomplete  Culture, blood (routine x 2)     Status: None (Preliminary result)   Collection Time: 03/30/15  1:40 PM  Result Value Ref Range Status   Specimen Description BLOOD LEFT WRIST  Final   Special Requests BOTTLES DRAWN AEROBIC AND ANAEROBIC 5 CC EA  Final   Culture  Setup Time   Final    GRAM NEGATIVE RODS IN BOTH AEROBIC AND ANAEROBIC BOTTLES CRITICAL RESULT CALLED TO, READ BACK BY AND VERIFIED WITH: A KOONTZ 1610 03/31/15 MKELLY    Culture   Final    GRAM NEGATIVE RODS Performed at [facility]    Report Status PENDING  Incomplete  Culture, blood (routine x 2)     Status: None  (Preliminary result)   Collection Time: 03/30/15  1:50 PM  Result Value Ref Range Status   Specimen Description BLOOD LEFT FOREARM  Final   Special Requests BOTTLES DRAWN AEROBIC AND ANAEROBIC 5 CC EA  Final   Culture  Setup Time   Final    GRAM NEGATIVE RODS IN BOTH AEROBIC AND ANAEROBIC BOTTLES CRITICAL RESULT CALLED TO, READ BACK BY AND VERIFIED WITH: A. KOONTZ  03/31/15 MKELLY    Culture  Final    GRAM NEGATIVE RODS Performed at [facility]    Report Status PENDING  Incomplete  MRSA PCR Screening     Status: None   Collection Time: 03/30/15  4:40 PM  Result Value Ref Range Status   MRSA by PCR NEGATIVE NEGATIVE Final    Comment:        The GeneXpert MRSA Assay (FDA approved for NASAL specimens only), is one component of a comprehensive MRSA colonization surveillance program. It is not intended to diagnose MRSA infection nor to guide or monitor treatment for MRSA infections.      Medications:   . atorvastatin  20 mg Oral QHS  . benztropine  0.5 mg Oral BID  . ceFEPime (MAXIPIME) IV  1 g Intravenous Q24H  . chlorhexidine  15 mL Mouth/Throat BID  . divalproex  500 mg Oral BID  . enoxaparin (LOVENOX) injection  30 mg Subcutaneous Q24H  . escitalopram  10 mg Oral Daily  . feeding supplement (ENSURE ENLIVE)  237 mL Oral TID BM  . omega-3 acid ethyl esters  2 g Oral BID  . [START ON 04/01/2015] pneumococcal 23 valent vaccine  0.5 mL Intramuscular Tomorrow-1000  . QUEtiapine  50 mg Oral BID   Continuous Infusions: . sodium chloride 150 mL/hr at 03/30/15 1654    Time spent: 35 minutes.  The patient is medically complex with multiple co-morbidities and is at high risk for clinical deterioration and requires high complexity decision making.    LOS: 1 day   RAMA,CHRISTINA  [facility] Pager [phone]. If unable to reach me by pager, please call my cell phone at [phone].  *Please refer to [email or web address], password [email or web address] to get updated schedule on who  will round on this patient, as hospitalists switch teams weekly. If 7PM-7AM, please contact night-coverage at [email or web address], password [email or web address] for any overnight needs.  03/31/2015, 1:56 PM

## 2015-03-31 NOTE — Consult Note (Addendum)
WOC wound consult note Reason for Consult: Consult requested for sacrum.   Wound type: Stage 1 pressure injury to upper sacrum area was noted upon admission Pressure Ulcer POA: Yes Measurement: .8X.5cm area of nonblanchable erythremia.  Foam dressing had previously been applied but pt is currently incontinent of large amt unformed stool. Dressing procedure/placement/frequency: Foam dressing will trap stool underneath; apply barrier cream PRN when turning or cleaning to protect skin. Please re-consult if further assistance is needed.  Thank-you,  Cammie Mcgeeawn Shaley Leavens MSN, RN, CWOCN, NeibertWCN-AP, CNS [phone]

## 2015-04-01 ENCOUNTER — Inpatient Hospital Stay (HOSPITAL_COMMUNITY): Payer: Medicare Other

## 2015-04-01 DIAGNOSIS — A4151 Sepsis due to Escherichia coli [E. coli]: Principal | ICD-10-CM

## 2015-04-01 LAB — BASIC METABOLIC PANEL
Anion gap: 9 (ref 5–15)
BUN: 76 mg/dL — AB (ref 6–20)
CALCIUM: 7.7 mg/dL — AB (ref 8.9–10.3)
CO2: 21 mmol/L — AB (ref 22–32)
CREATININE: 2.44 mg/dL — AB (ref 0.61–1.24)
Chloride: 116 mmol/L — ABNORMAL HIGH (ref 101–111)
GFR calc Af Amer: 29 mL/min — ABNORMAL LOW (ref >60)
GFR calc non Af Amer: 25 mL/min — ABNORMAL LOW (ref >60)
GLUCOSE: 84 mg/dL (ref 65–99)
Potassium: 4.6 mmol/L (ref 3.5–5.1)
Sodium: 146 mmol/L — ABNORMAL HIGH (ref 135–145)

## 2015-04-01 LAB — CBC
HCT: 35 % — ABNORMAL LOW (ref 39.0–52.0)
HEMOGLOBIN: 11.7 g/dL — AB (ref 13.0–17.0)
MCH: 31.8 pg (ref 26.0–34.0)
MCHC: 33.4 g/dL (ref 30.0–36.0)
MCV: 95.1 fL (ref 78.0–100.0)
Platelets: 144 10*3/uL — ABNORMAL LOW (ref 150–400)
RBC: 3.68 MIL/uL — ABNORMAL LOW (ref 4.22–5.81)
RDW: 14.8 % (ref 11.5–15.5)
WBC: 11.9 10*3/uL — ABNORMAL HIGH (ref 4.0–10.5)

## 2015-04-01 LAB — URINE CULTURE: Culture: >=100000

## 2015-04-01 MED ORDER — FUROSEMIDE 10 MG/ML IJ SOLN: 40.0000 mg | Freq: Once | INTRAMUSCULAR | Status: DC

## 2015-04-01 MED ORDER — VITAMINS A & D EX OINT
TOPICAL_OINTMENT | CUTANEOUS | Status: AC
  Administered 2015-04-01: 1
  Filled 2015-04-01: qty 5

## 2015-04-01 MED ORDER — HYDRALAZINE HCL 20 MG/ML IJ SOLN: 5.0000 mg | Freq: Once | INTRAMUSCULAR | Status: DC

## 2015-04-01 MED ORDER — SODIUM CHLORIDE 0.45 % IV SOLN
INTRAVENOUS | Status: DC
  Administered 2015-04-01 – 2015-04-02 (×4): via INTRAVENOUS
  Administered 2015-04-02: 150 mL/h via INTRAVENOUS
  Administered 2015-04-03: 03:00:00 via INTRAVENOUS

## 2015-04-01 MED ORDER — CETYLPYRIDINIUM CHLORIDE 0.05 % MT LIQD
7.0000 mL | Freq: Two times a day (BID) | OROMUCOSAL | Status: DC
  Administered 2015-04-02 – 2015-04-11 (×13): 7 mL via OROMUCOSAL

## 2015-04-01 MED ORDER — CHLORHEXIDINE GLUCONATE 0.12 % MT SOLN
15.0000 mL | Freq: Two times a day (BID) | OROMUCOSAL | Status: DC
  Filled 2015-04-01 (×4): qty 15

## 2015-04-01 NOTE — Progress Notes (Addendum)
Progress Note   Gary [name] QMV:[id number] DOB: [birth year] DOA: 03/30/2015 PCP: Cheral Bay, MD   Brief Narrative:   Gary [name] is an 74 y.o. male with a PMH of mental retardation, from [facility], who was sent to the ED for evaluation of altered mental status, hypoxia and low BP. Upon initial evaluation in the ED, he was found to be febrile, hypotensive, and with AKI. CXR shows pneumonia.  Assessment/Plan:   Principal Problem:  Sepsis (HCC) secondary to gram-negative rod bacteremia secondary to either HCAP or UTI - This patient felt to be at high risk of poor outcomes with a SOFA score of 2-3. Admitted to SDU. - Source thought to be from lungs with pneumonia findings on CXR. U/A shows turbid urine with moderate WBC. - Preliminary blood cultures growing GNR in both bottles. Urine cultures growing Escherichia coli. - Pro calcitonin and lactic acid both elevated on admission. Lactate has cleared. - Fluid volume resuscitated with 30 mg/kg using weight based algorithm per sepsis order set (3 Liters given). - Antibiotics narrowed to Cefepime 03/31/15 given GNR bacteremia. - Blood pressure improved status post fluid volume resuscitation. - Check chest x-ray.  MEWS score 6.  Active Problems:   Hyperkalemia - Possibly a hemolyzed specimen versus related to acute kidney injury. WNL on recheck.   AKI - Secondary to sepsis. Baseline creatinine is 0.9. -  Decrease IV fluids until CHF ruled out, creatinine beginning to improve, but mildly hypernatremic .   Mental retardation with behavioral disturbance - Continue Depakote, Lexapro, Ativan PRN and Seroquel. - Depakote level therapeutic.   Hyperlipidemia - Continue statin/fish oil.   Parkinson's disease (HCC) - Unclear dx.Nephew not aware of this diagnosis. Not on meds for this. PT eval when stable.    Pressure ulcer - Present on admission. Skin care per wound care nurses recommendations.   DVT Prophylaxis - Lovenox ordered.   Family Communication/Anticipated D/C date and plan/Code Status   Family Communication: Nephew at bedside 03/31/15 . 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 Anticipated D/C date:   Likely several more days, once sepsis resolved and culture data back. Still critically ill.  Code Status: Full code.   IV Access:    Peripheral IV   Procedures and diagnostic studies:   Dg Chest 2 View  03/30/2015  CLINICAL DATA:  Altered mental status.  Abnormal lung exam. EXAM: CHEST  2 VIEW COMPARISON:  12/17/2014 and 12/07/2014 FINDINGS: Artifact overlies chest. Heart size is normal. Mediastinal shadows are normal. There is patchy density in both lower lobes consistent with atelectasis and basilar pneumonia. Upper lungs are clear. No effusions. No acute bone finding. IMPRESSION: Abnormal patchy density in both lower lobes consistent with pneumonia. Electronically Signed   By: Paulina Fusi M.D.   On: 03/30/2015 13:42     Medical Consultants:    None.  Anti-Infectives:    Vancomycin 03/30/15--->  Cefepime 03/30/15--->   Subjective:   Gary [name] is still a bit lethargic.  No N/V/D over night. More short of breath today.  Not coughing.  Objective:    Filed Vitals:   04/01/15 0900 04/01/15 1000 04/01/15 1100 04/01/15 1200  BP: 158/76 150/83 156/71 160/75  Pulse: 81 82 87 33  Temp:    102.9 F (39.4 C)  TempSrc:    Oral  Resp: Height:      Weight:      SpO2: 94% 94% 92% 98%    Intake/Output  Summary (Last 24 hours) at 04/01/15 1257 Last data filed at 04/01/15 1200  Gross per 24 hour  Intake   3720 ml  Output   1605 ml  Net   2115 ml   Filed Weights   03/30/15 1327 03/31/15 0400  Weight: 68.04 kg (150 lb) 62.4 kg (137 lb 9.1 oz)   Gen: NAD Cardiovascular: Tachy, No M/R/G Respiratory: Lungs diminished, increased WOB Gastrointestinal: Abdomen soft, NT/ND, + BS Extremities: No C/E/C   Data  Reviewed:    Labs: Basic Metabolic Panel:  Recent Labs Lab 03/30/15 1342 03/31/15 0355 03/31/15 0755 04/01/15 0335  NA 137 141  --  146*  K 5.0 5.6* 4.8 4.6  CL 97* 106  --  116*  CO2 26 27  --  21*  GLUCOSE 118* 86  --  84  BUN 66* 71*  --  76*  CREATININE 3.16* 3.03*  --  2.44*  CALCIUM 8.2* 7.6*  --  7.7*   GFR Estimated Creatinine Clearance: 23.8 mL/min (by C-G formula based on Cr of 2.44). Liver Function Tests:  Recent Labs Lab 03/30/15 1342  AST 35  ALT 24  ALKPHOS 74  BILITOT 0.7  PROT 6.1*  ALBUMIN 2.7*   Coagulation profile  Recent Labs Lab 03/30/15 1330  INR 1.24    CBC:  Recent Labs Lab 03/30/15 1342 03/31/15 0355 04/01/15 0335  WBC 15.5* 16.5* 11.9*  NEUTROABS 14.4*  --   --   HGB 12.7* 11.3* 11.7*  HCT 37.5* 35.0* 35.0*  MCV 95.4 98.9 95.1  PLT 159 155 144*   CBG:  Recent Labs Lab 03/30/15 1316  GLUCAP 104*   Sepsis Labs:  Recent Labs Lab 03/30/15 1342 03/30/15 1401 03/30/15 1812 03/31/15 0355 03/31/15 0755 04/01/15 0335  PROCALCITON  --   --  3.35  --   --   --   WBC 15.5*  --   --  16.5*  --  11.9*  LATICACIDVEN  --  2.69*  --   --  1.4  --    Microbiology Recent Results (from the past 240 hour(s))  Urine culture     Status: None   Collection Time: 03/30/15  1:31 PM  Result Value Ref Range Status   Specimen Description URINE, CATHETERIZED  Final   Special Requests NONE  Final   Culture   Final    >=100,000 COLONIES/mL ESCHERICHIA COLI [facility]formed at [facility]    Report Status 04/01/2015 FINAL  Final   Organism ID, Bacteria ESCHERICHIA COLI  Final      Susceptibility   Escherichia coli - MIC*    AMPICILLIN <=2 SENSITIVE Sensitive     CEFAZOLIN <=4 SENSITIVE Sensitive     CEFTRIAXONE <=1 SENSITIVE Sensitive     CIPROFLOXACIN <=0.25 SENSITIVE Sensitive     GENTAMICIN 2 SENSITIVE Sensitive     IMIPENEM <=0.25 SENSITIVE Sensitive     NITROFURANTOIN <=16 SENSITIVE Sensitive     TRIMETH/SULFA <=20  SENSITIVE Sensitive     AMPICILLIN/SULBACTAM <=2 SENSITIVE Sensitive     PIP/TAZO <=4 SENSITIVE Sensitive     * >=100,000 COLONIES/mL ESCHERICHIA COLI  Culture, blood (routine x 2)     Status: None (Preliminary result)   Collection Time: 03/30/15  1:40 PM  Result Value Ref Range Status   Specimen Description BLOOD LEFT WRIST  Final   Special Requests BOTTLES DRAWN AEROBIC AND ANAEROBIC 5 CC EA  Final   Culture  Setup Time   Final    GRAM NEGATIVE  RODS IN BOTH AEROBIC AND ANAEROBIC BOTTLES CRITICAL RESULT CALLED TO, READ BACK BY AND VERIFIED WITH: A KOONTZ 2536 03/31/15 MKELLY    Culture   Final    ESCHERICHIA COLI Performed at [facility]    Report Status PENDING  Incomplete  Culture, blood (routine x 2)     Status: None (Preliminary result)   Collection Time: 03/30/15  1:50 PM  Result Value Ref Range Status   Specimen Description BLOOD LEFT FOREARM  Final   Special Requests BOTTLES DRAWN AEROBIC AND ANAEROBIC 5 CC EA  Final   Culture  Setup Time   Final    GRAM NEGATIVE RODS IN BOTH AEROBIC AND ANAEROBIC BOTTLES CRITICAL RESULT CALLED TO, READ BACK BY AND VERIFIED WITH: A. KOONTZ  03/31/15 MKELLY    Culture   Final    ESCHERICHIA COLI Performed at [facility]    Report Status PENDING  Incomplete  MRSA PCR Screening     Status: None   Collection Time: 03/30/15  4:40 PM  Result Value Ref Range Status   MRSA by PCR NEGATIVE NEGATIVE Final    Comment:        The GeneXpert MRSA Assay (FDA approved for NASAL specimens only), is one component of a comprehensive MRSA colonization surveillance program. It is not intended to diagnose MRSA infection nor to guide or monitor treatment for MRSA infections.      Medications:   . atorvastatin  20 mg Oral QHS  . benztropine  0.5 mg Oral BID  . ceFEPime (MAXIPIME) IV  1 g Intravenous Q24H  . chlorhexidine  15 mL Mouth/Throat BID  . divalproex  500 mg Oral BID  . enoxaparin (LOVENOX) injection  30 mg  Subcutaneous Q24H  . escitalopram  10 mg Oral Daily  . feeding supplement (ENSURE ENLIVE)  237 mL Oral TID BM  . furosemide  40 mg Intravenous Once  . omega-3 acid ethyl esters  2 g Oral BID  . pneumococcal 23 valent vaccine  0.5 mL Intramuscular Tomorrow-1000  . QUEtiapine  50 mg Oral BID   Continuous Infusions: . sodium chloride      Time spent: 35 minutes.  The patient is medically complex with multiple co-morbidities and is at high risk for clinical deterioration and requires high complexity decision making.    LOS: 2 days   Albertia Carvin  [facility] Pager [phone]. If unable to reach me by pager, please call my cell phone at [phone].  *Please refer to [email or web address], password [email or web address] to get updated schedule on who will round on this patient, as hospitalists switch teams weekly. If 7PM-7AM, please contact night-coverage at [email or web address], password [email or web address] for any overnight needs.  04/01/2015, 12:57 PM

## 2015-04-02 DIAGNOSIS — E870 Hyperosmolality and hypernatremia: Secondary | ICD-10-CM

## 2015-04-02 LAB — CULTURE, BLOOD (ROUTINE X 2)

## 2015-04-02 LAB — BASIC METABOLIC PANEL
ANION GAP: 8 (ref 5–15)
BUN: 74 mg/dL — ABNORMAL HIGH (ref 6–20)
CHLORIDE: 117 mmol/L — AB (ref 101–111)
CO2: 21 mmol/L — ABNORMAL LOW (ref 22–32)
CREATININE: 2.47 mg/dL — AB (ref 0.61–1.24)
Calcium: 7.5 mg/dL — ABNORMAL LOW (ref 8.9–10.3)
GFR calc non Af Amer: 24 mL/min — ABNORMAL LOW (ref >60)
GFR, EST AFRICAN AMERICAN: 28 mL/min — AB (ref >60)
Glucose, Bld: 93 mg/dL (ref 65–99)
POTASSIUM: 4.5 mmol/L (ref 3.5–5.1)
SODIUM: 146 mmol/L — AB (ref 135–145)

## 2015-04-02 LAB — CBC
HEMATOCRIT: 36.2 % — AB (ref 39.0–52.0)
Hemoglobin: 11.6 g/dL — ABNORMAL LOW (ref 13.0–17.0)
MCH: 30.9 pg (ref 26.0–34.0)
MCHC: 32 g/dL (ref 30.0–36.0)
MCV: 96.3 fL (ref 78.0–100.0)
PLATELETS: 106 10*3/uL — AB (ref 150–400)
RBC: 3.76 MIL/uL — AB (ref 4.22–5.81)
RDW: 15.4 % (ref 11.5–15.5)
WBC: 12.9 10*3/uL — AB (ref 4.0–10.5)

## 2015-04-02 MED ORDER — LIP MEDEX EX OINT
TOPICAL_OINTMENT | CUTANEOUS | Status: AC
  Administered 2015-04-02: 17:00:00
  Filled 2015-04-02: qty 7

## 2015-04-02 MED ORDER — CEFAZOLIN SODIUM 1-5 GM-% IV SOLN
1.0000 g | Freq: Two times a day (BID) | INTRAVENOUS | Status: DC
  Administered 2015-04-03 – 2015-04-04 (×4): 1 g via INTRAVENOUS
  Filled 2015-04-02 (×5): qty 50

## 2015-04-02 NOTE — Progress Notes (Signed)
Progress Note   Gary [name]:[id number] DOB: 07-31-1940 DOA: 03/30/2015 PCP: Cheral Bay, MD   Brief Narrative:   Gary [name] is an 75 y.o. male with a PMH of mental retardation, from [facility], who was sent to the ED for evaluation of altered mental status, hypoxia and low BP. Upon initial evaluation in the ED, he was found to be febrile, hypotensive, and with AKI. CXR shows pneumonia.  Assessment/Plan:   Principal Problem:  Sepsis (HCC) secondary to gram-negative rod bacteremia secondary to either HCAP or UTI - This patient felt to be at high risk of poor outcomes with a SOFA score of 2-3. Admitted to SDU. - Source thought to be from lungs with pneumonia findings on CXR. U/A shows turbid urine with moderate WBC. - Preliminary blood cultures growing GNR in both bottles. Urine cultures growing Escherichia coli. - Pro calcitonin and lactic acid both elevated on admission. Lactate has cleared. - Fluid volume resuscitated with 30 mg/kg using weight based algorithm per sepsis order set (3 Liters given). - Antibiotics narrowed to Cefepime 03/31/15 given GNR bacteremia. - Blood pressure improved status post fluid volume resuscitation. - Check chest x-ray.  MEWS score 2.  Tx to floor.  Active Problems:   Hyperkalemia - Possibly a hemolyzed specimen versus related to acute kidney injury. WNL on recheck.   AKI / hypernatremia - Secondary to sepsis. Baseline creatinine is 0.9.  Creatinine remains elevated over usual baseline values. - Continue hypotonic IVF.   Mental retardation with behavioral disturbance - Continue Depakote, Lexapro, Ativan PRN and Seroquel. - Depakote level therapeutic.   Hyperlipidemia - Continue statin/fish oil.   Parkinson's disease (HCC) - Unclear dx.Nephew not aware of this diagnosis. Not on meds for this. PT eval when stable.    Pressure ulcer - Present on admission. Skin care per wound care nurses  recommendations.    DVT Prophylaxis - Lovenox ordered.   Family Communication/Anticipated D/C date and plan/Code Status   Family Communication: Nephew at bedside 03/31/15. 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 Anticipated D/C date:   Likely 2-3, once sepsis resolved and culture data back. Slowly improving.  Code Status: Full code.   IV Access:    Peripheral IV   Procedures and diagnostic studies:   Dg Chest 2 View  03/30/2015  CLINICAL DATA:  Altered mental status.  Abnormal lung exam. EXAM: CHEST  2 VIEW COMPARISON:  12/17/2014 and 12/07/2014 FINDINGS: Artifact overlies chest. Heart size is normal. Mediastinal shadows are normal. There is patchy density in both lower lobes consistent with atelectasis and basilar pneumonia. Upper lungs are clear. No effusions. No acute bone finding. IMPRESSION: Abnormal patchy density in both lower lobes consistent with pneumonia. Electronically Signed   By: Paulina Fusi M.D.   On: 03/30/2015 13:42   Dg Chest Port 1 View  04/01/2015  CLINICAL DATA:  Short of breath.  Dyspnea.  Mental retardation. EXAM: PORTABLE CHEST 1 VIEW COMPARISON:  03/30/2015 FINDINGS: Minimally motion degraded AP portable view. Patient rotated left. Cardiomegaly accentuated by AP portable technique. Possible small left pleural effusion. No pneumothorax. Low lung volumes with resultant pulmonary interstitial prominence. Progression of left worse than right bibasilar airspace disease. IMPRESSION: Cardiomegaly and low lung volumes. Progressive bibasilar airspace disease, suspicious for infection or aspiration. Electronically Signed   By: Jeronimo Greaves M.D.   On: 04/01/2015 14:58    Medical Consultants:    None.  Anti-Infectives:    Vancomycin 03/30/15--->03/31/15  Cefepime 03/30/15--->  Subjective:   Gary [name] is still a bit lethargic.  No N/V/D over night. No cough.  Still on [place] diet, but has been eating some apple sauce with pills.  No  reports of coughing with intake.  Objective:    Filed Vitals:   04/02/15 0200 04/02/15 0400 04/02/15 0600 04/02/15 0715  BP: 135/55 118/58 150/71   Pulse: 68 61 61 66  Temp:  98.4 F (36.9 C)    TempSrc:  Axillary    Resp: 18 18 18 19   Height:      Weight:      SpO2: 95% 94% 92% 98%    Intake/Output Summary (Last 24 hours) at 04/02/15 0804 Last data filed at 04/02/15 0700  Gross per 24 hour  Intake   3330 ml  Output   1385 ml  Net   1945 ml   Filed Weights   03/30/15 1327 03/31/15 0400  Weight: 68.04 kg (150 lb) 62.4 kg (137 lb 9.1 oz)   Gen: NAD Cardiovascular: RRR,  Faint murmur of AS noted Respiratory: Lungs diminished, increased WOB Gastrointestinal: Abdomen soft, NT/ND, + BS Extremities: No C/E/C   Data Reviewed:    Labs: Basic Metabolic Panel:  Recent Labs Lab 03/30/15 1342 03/31/15 0355  04/01/15 0335 04/02/15 0342  NA 137 141  --  146* 146*  K 5.0 5.6*  < > 4.6 4.5  CL 97* 106  --  116* 117*  CO2 26 27  --  21* 21*  GLUCOSE 118* 86  --  84 93  BUN 66* 71*  --  76* 74*  CREATININE 3.16* 3.03*  --  2.44* 2.47*  CALCIUM 8.2* 7.6*  --  7.7* 7.5*  < > = values in this interval not displayed. GFR Estimated Creatinine Clearance: 23.5 mL/min (by C-G formula based on Cr of 2.47). Liver Function Tests:  Recent Labs Lab 03/30/15 1342  AST 35  ALT 24  ALKPHOS 74  BILITOT 0.7  PROT 6.1*  ALBUMIN 2.7*   Coagulation profile  Recent Labs Lab 03/30/15 1330  INR 1.24    CBC:  Recent Labs Lab 03/30/15 1342 03/31/15 0355 04/01/15 0335 04/02/15 0342  WBC 15.5* 16.5* 11.9* 12.9*  NEUTROABS 14.4*  --   --   --   HGB 12.7* 11.3* 11.7* 11.6*  HCT 37.5* 35.0* 35.0* 36.2*  MCV 95.4 98.9 95.1 96.3  PLT 159 155 144* 106*   CBG:  Recent Labs Lab 03/30/15 1316  GLUCAP 104*   Sepsis Labs:  Recent Labs Lab 03/30/15 1342 03/30/15 1401 03/30/15 1812 03/31/15 0355 03/31/15 0755 04/01/15 0335 04/02/15 0342  PROCALCITON  --   --   3.35  --   --   --   --   WBC 15.5*  --   --  16.5*  --  11.9* 12.9*  LATICACIDVEN  --  2.69*  --   --  1.4  --   --    Microbiology Recent Results (from the past 240 hour(s))  Urine culture     Status: None   Collection Time: 03/30/15  1:31 PM  Result Value Ref Range Status   Specimen Description URINE, CATHETERIZED  Final   Special Requests NONE  Final   Culture   Final    >=100,000 COLONIES/mL ESCHERICHIA COLI Performed at [facility]    Report Status 04/01/2015 FINAL  Final   Organism ID, Bacteria ESCHERICHIA COLI  Final      Susceptibility   Escherichia coli - MIC*  AMPICILLIN <=2 SENSITIVE Sensitive     CEFAZOLIN <=4 SENSITIVE Sensitive     CEFTRIAXONE <=1 SENSITIVE Sensitive     CIPROFLOXACIN <=0.25 SENSITIVE Sensitive     GENTAMICIN 2 SENSITIVE Sensitive     IMIPENEM <=0.25 SENSITIVE Sensitive     NITROFURANTOIN <=16 SENSITIVE Sensitive     TRIMETH/SULFA <=20 SENSITIVE Sensitive     AMPICILLIN/SULBACTAM <=2 SENSITIVE Sensitive     PIP/TAZO <=4 SENSITIVE Sensitive     * >=100,000 COLONIES/mL ESCHERICHIA COLI  Culture, blood (routine x 2)     Status: None (Preliminary result)   Collection Time: 03/30/15  1:40 PM  Result Value Ref Range Status   Specimen Description BLOOD LEFT WRIST  Final   Special Requests BOTTLES DRAWN AEROBIC AND ANAEROBIC 5 CC EA  Final   Culture  Setup Time   Final    GRAM NEGATIVE RODS IN BOTH AEROBIC AND ANAEROBIC BOTTLES CRITICAL RESULT CALLED TO, READ BACK BY AND VERIFIED WITH: A KOONTZ 0727 03/31/15 MKELLY    Culture   Final    ESCHERICHIA COLI Performed at [facility]    Report Status PENDING  Incomplete  Culture, blood (routine x 2)     Status: None (Preliminary result)   Collection Time: 03/30/15  1:50 PM  Result Value Ref Range Status   Specimen Description BLOOD LEFT FOREARM  Final   Special Requests BOTTLES DRAWN AEROBIC AND ANAEROBIC 5 CC EA  Final   Culture  Setup Time   Final    GRAM NEGATIVE RODS IN BOTH  AEROBIC AND ANAEROBIC BOTTLES CRITICAL RESULT CALLED TO, READ BACK BY AND VERIFIED WITH: A. KOONTZ @0729  03/31/15 MKELLY    Culture   Final    ESCHERICHIA COLI Performed at [facility]    Report Status PENDING  Incomplete  MRSA PCR Screening     Status: None   Collection Time: 03/30/15  4:40 PM  Result Value Ref Range Status   MRSA by PCR NEGATIVE NEGATIVE Final    Comment:        The GeneXpert MRSA Assay (FDA approved for NASAL specimens only), is one component of a comprehensive MRSA colonization surveillance program. It is not intended to diagnose MRSA infection nor to guide or monitor treatment for MRSA infections.      Medications:   . antiseptic oral rinse  7 mL Mouth Rinse q12n4p  . atorvastatin  20 mg Oral QHS  . benztropine  0.5 mg Oral BID  . ceFEPime (MAXIPIME) IV  1 g Intravenous Q24H  . chlorhexidine  15 mL Mouth/Throat BID  . chlorhexidine  15 mL Mouth Rinse BID  . divalproex  500 mg Oral BID  . enoxaparin (LOVENOX) injection  30 mg Subcutaneous Q24H  . escitalopram  10 mg Oral Daily  . feeding supplement (ENSURE ENLIVE)  237 mL Oral TID BM  . omega-3 acid ethyl esters  2 g Oral BID  . pneumococcal 23 valent vaccine  0.5 mL Intramuscular Tomorrow-1000  . QUEtiapine  50 mg Oral BID   Continuous Infusions: . sodium chloride 125 mL/hr at 04/02/15 0700    Time spent: 35 minutes.  The patient is medically complex with multiple co-morbidities and is at high risk for clinical deterioration and requires high complexity decision making.    LOS: 3 days   RAMA,CHRISTINA  [facility] Pager [phone]. If unable to reach me by pager, please call my cell phone at [phone].  *Please refer to [email or web address], password [email or web address] to get updated schedule  on who will round on this patient, as hospitalists switch teams weekly. If 7PM-7AM, please contact night-coverage at [email or web address], password [email or web address] for any overnight needs.  04/02/2015, 8:04 AM

## 2015-04-02 NOTE — Progress Notes (Signed)
Right before RN attempted to place foley catheter patient urinated about 256m of urine. RN bladder scanned the patient again and the scan showed >4057mof urine still in the bladder. RN attempted to place the foley catheter but met resistance and was unable to pass it into the bladder. MD notified. Will continue to monitor.

## 2015-04-03 LAB — BASIC METABOLIC PANEL
ANION GAP: 6 (ref 5–15)
BUN: 77 mg/dL — AB (ref 6–20)
CO2: 24 mmol/L (ref 22–32)
Calcium: 7.9 mg/dL — ABNORMAL LOW (ref 8.9–10.3)
Chloride: 117 mmol/L — ABNORMAL HIGH (ref 101–111)
Creatinine, Ser: 2.33 mg/dL — ABNORMAL HIGH (ref 0.61–1.24)
GFR calc Af Amer: 30 mL/min — ABNORMAL LOW (ref >60)
GFR calc non Af Amer: 26 mL/min — ABNORMAL LOW (ref >60)
GLUCOSE: 90 mg/dL (ref 65–99)
POTASSIUM: 4.5 mmol/L (ref 3.5–5.1)
Sodium: 147 mmol/L — ABNORMAL HIGH (ref 135–145)

## 2015-04-03 MED ORDER — DEXTROSE 5 % IV SOLN
INTRAVENOUS | Status: DC
  Administered 2015-04-03 – 2015-04-05 (×3): via INTRAVENOUS
  Administered 2015-04-06: 100 mL via INTRAVENOUS
  Administered 2015-04-06 – 2015-04-08 (×3): via INTRAVENOUS

## 2015-04-03 NOTE — Progress Notes (Signed)
Progress Note   Gary [name] ZOX:[id number] DOB: 01/16/1941 DOA: 03/30/2015 PCP: Cheral Bay, MD   Brief Narrative:   Gary [name] is an 75 y.o. male with a PMH of mental retardation, from [facility], who was sent to the ED for evaluation of altered mental status, hypoxia and low BP. Upon initial evaluation in the ED, he was found to be febrile, hypotensive, and with AKI. CXR shows pneumonia.  Assessment/Plan:   Principal Problem:  Sepsis (HCC) secondary to gram-negative rod bacteremia secondary to either HCAP or UTI - This patient felt to be at high risk of poor outcomes with a SOFA score of 2-3. Admitted to SDU. - Source thought to be from lungs with pneumonia findings on CXR. U/A shows turbid urine with moderate WBC. - Preliminary blood cultures growing GNR in both bottles. Urine cultures growing Escherichia coli. - Pro calcitonin and lactic acid both elevated on admission. Lactate has cleared. - Fluid volume resuscitated with 30 mg/kg using weight based algorithm per sepsis order set (3 Liters given). - Antibiotics narrowed to Cefepime 03/31/15 given GNR bacteremia. - Blood pressure improved status post fluid volume resuscitation. - Slowly improving, try to mobilize.  Active Problems:   Hyperkalemia - Possibly a hemolyzed specimen versus related to acute [name] injury. WNL on recheck.   AKI / hypernatremia - Secondary to sepsis. Baseline creatinine is 0.9.  Creatinine remains elevated over usual baseline values. - Continue hypotonic IVF.   Mental retardation with behavioral disturbance - Continue Depakote, Lexapro, Ativan PRN and Seroquel. - Depakote level therapeutic.   Hyperlipidemia - Continue statin/fish oil.   Parkinson's disease (HCC) - Unclear dx.Nephew not aware of this diagnosis. Not on meds for this. PT eval when stable.    Pressure ulcer - Present on admission. Skin care per wound care nurses recommendations.    DVT  Prophylaxis - Lovenox ordered.   Family Communication/Anticipated D/C date and plan/Code Status   Family Communication: Nephew at bedside 03/31/15. 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 Anticipated D/C date:   Likely tomorrow if mental status improves. Still a bit somnolent. Code Status: Full code.   IV Access:    Peripheral IV   Procedures and diagnostic studies:   Dg Chest 2 View  03/30/2015  CLINICAL DATA:  Altered mental status.  Abnormal lung exam. EXAM: CHEST  2 VIEW COMPARISON:  12/17/2014 and 12/07/2014 FINDINGS: Artifact overlies chest. Heart size is normal. Mediastinal shadows are normal. There is patchy density in both lower lobes consistent with atelectasis and basilar pneumonia. Upper lungs are clear. No effusions. No acute bone finding. IMPRESSION: Abnormal patchy density in both lower lobes consistent with pneumonia. Electronically Signed   By: Paulina Fusi M.D.   On: 03/30/2015 13:42   Dg Chest Port 1 View  04/01/2015  CLINICAL DATA:  Short of breath.  Dyspnea.  Mental retardation. EXAM: PORTABLE CHEST 1 VIEW COMPARISON:  03/30/2015 FINDINGS: Minimally motion degraded AP portable view. Patient rotated left. Cardiomegaly accentuated by AP portable technique. Possible small left pleural effusion. No pneumothorax. Low lung volumes with resultant pulmonary interstitial prominence. Progression of left worse than right bibasilar airspace disease. IMPRESSION: Cardiomegaly and low lung volumes. Progressive bibasilar airspace disease, suspicious for infection or aspiration. Electronically Signed   By: Jeronimo Greaves M.D.   On: 04/01/2015 14:58    Medical Consultants:    None.  Anti-Infectives:    Vancomycin 03/30/15--->03/31/15  Cefepime 03/30/15--->   Subjective:   Gary [name] still a  bit sleepy. No reports of shortness of breath, nausea or vomiting.  Objective:    Filed Vitals:   04/02/15 1014 04/02/15 1358 04/02/15 2100 04/03/15 0550    BP: 131/66 128/59 125/92 176/69  Pulse: 65 63 51 68  Temp: 98.5 F (36.9 C) 98.2 F (36.8 C) 98.6 F (37 C) 97.4 F (36.3 C)  TempSrc: Oral Oral Oral Oral  Resp: 21 20 18 18   Height:      Weight:      SpO2: 91% 95% 93% 96%    Intake/Output Summary (Last 24 hours) at 04/03/15 1243 Last data filed at 04/03/15 1007  Gross per 24 hour  Intake 4207.5 ml  Output    425 ml  Net 3782.5 ml   Filed Weights   03/30/15 1327 03/31/15 0400  Weight: 68.04 kg (150 lb) 62.4 kg (137 lb 9.1 oz)   Gen: NAD Cardiovascular: RRR,  Faint murmur of AS noted Respiratory: Lungs diminished, increased WOB Gastrointestinal: Abdomen soft, NT/ND, + BS Extremities: No C/E/C   Data Reviewed:    Labs: Basic Metabolic Panel:  Recent Labs Lab 03/30/15 1342 03/31/15 0355  04/01/15 0335 04/02/15 0342 04/03/15 0600  NA 137 141  --  146* 146* 147*  K 5.0 5.6*  < > 4.6 4.5 4.5  CL 97* 106  --  116* 117* 117*  CO2 26 27  --  21* 21* 24  GLUCOSE 118* 86  --  84 93 90  BUN 66* 71*  --  76* 74* 77*  CREATININE 3.16* 3.03*  --  2.44* 2.47* 2.33*  CALCIUM 8.2* 7.6*  --  7.7* 7.5* 7.9*  < > = values in this interval not displayed. GFR Estimated Creatinine Clearance: 24.9 mL/min (by C-G formula based on Cr of 2.33). Liver Function Tests:  Recent Labs Lab 03/30/15 1342  AST 35  ALT 24  ALKPHOS 74  BILITOT 0.7  PROT 6.1*  ALBUMIN 2.7*   Coagulation profile  Recent Labs Lab 03/30/15 1330  INR 1.24    CBC:  Recent Labs Lab 03/30/15 1342 03/31/15 0355 04/01/15 0335 04/02/15 0342  WBC 15.5* 16.5* 11.9* 12.9*  NEUTROABS 14.4*  --   --   --   HGB 12.7* 11.3* 11.7* 11.6*  HCT 37.5* 35.0* 35.0* 36.2*  MCV 95.4 98.9 95.1 96.3  PLT 159 155 144* 106*   CBG:  Recent Labs Lab 03/30/15 1316  GLUCAP 104*   Sepsis Labs:  Recent Labs Lab 03/30/15 1342 03/30/15 1401 03/30/15 1812 03/31/15 0355 03/31/15 0755 04/01/15 0335 04/02/15 0342  PROCALCITON  --   --  3.35  --   --    --   --   WBC 15.5*  --   --  16.5*  --  11.9* 12.9*  LATICACIDVEN  --  2.69*  --   --  1.4  --   --    Microbiology Recent Results (from the past 240 hour(s))  Urine culture     Status: None   Collection Time: 03/30/15  1:31 PM  Result Value Ref Range Status   Specimen Description URINE, CATHETERIZED  Final   Special Requests NONE  Final   Culture   Final    >=100,000 COLONIES/mL ESCHERICHIA COLI Performed at [facility]    Report Status 04/01/2015 FINAL  Final   Organism ID, Bacteria ESCHERICHIA COLI  Final      Susceptibility   Escherichia coli - MIC*    AMPICILLIN <=2 SENSITIVE Sensitive  CEFAZOLIN <=4 SENSITIVE Sensitive     CEFTRIAXONE <=1 SENSITIVE Sensitive     CIPROFLOXACIN <=0.25 SENSITIVE Sensitive     GENTAMICIN 2 SENSITIVE Sensitive     IMIPENEM <=0.25 SENSITIVE Sensitive     NITROFURANTOIN <=16 SENSITIVE Sensitive     TRIMETH/SULFA <=20 SENSITIVE Sensitive     AMPICILLIN/SULBACTAM <=2 SENSITIVE Sensitive     PIP/TAZO <=4 SENSITIVE Sensitive     * >=100,000 COLONIES/mL ESCHERICHIA COLI  Culture, blood (routine x 2)     Status: None   Collection Time: 03/30/15  1:40 PM  Result Value Ref Range Status   Specimen Description BLOOD LEFT WRIST  Final   Special Requests BOTTLES DRAWN AEROBIC AND ANAEROBIC 5 CC EA  Final   Culture  Setup Time   Final    GRAM NEGATIVE RODS IN BOTH AEROBIC AND ANAEROBIC BOTTLES CRITICAL RESULT CALLED TO, READ BACK BY AND VERIFIED WITH: A KOONTZ 0727 03/31/15 MKELLY    Culture   Final    ESCHERICHIA COLI [facility]    Report Status 04/02/2015 FINAL  Final   Organism ID, Bacteria ESCHERICHIA COLI  Final      Susceptibility   Escherichia coli - MIC*    AMPICILLIN <=2 SENSITIVE Sensitive     CEFAZOLIN <=4 SENSITIVE Sensitive     CEFEPIME <=1 SENSITIVE Sensitive     CEFTAZIDIME <=1 SENSITIVE Sensitive     CEFTRIAXONE <=1 SENSITIVE Sensitive     CIPROFLOXACIN <=0.25 SENSITIVE Sensitive      GENTAMICIN <=1 SENSITIVE Sensitive     IMIPENEM <=0.25 SENSITIVE Sensitive     TRIMETH/SULFA <=20 SENSITIVE Sensitive     AMPICILLIN/SULBACTAM <=2 SENSITIVE Sensitive     PIP/TAZO <=4 SENSITIVE Sensitive     * ESCHERICHIA COLI  Culture, blood (routine x 2)     Status: None   Collection Time: 03/30/15  1:50 PM  Result Value Ref Range Status   Specimen Description BLOOD LEFT FOREARM  Final   Special Requests BOTTLES DRAWN AEROBIC AND ANAEROBIC 5 CC EA  Final   Culture  Setup Time   Final    GRAM NEGATIVE RODS IN BOTH AEROBIC AND ANAEROBIC BOTTLES CRITICAL RESULT CALLED TO, READ BACK BY AND VERIFIED WITH: A. KOONTZ @0729  03/31/15 MKELLY    Culture   Final    ESCHERICHIA COLI SUSCEPTIBILITIES PERFORMED ON PREVIOUS CULTURE WITHIN THE LAST 5 DAYS. Performed at [facility]    Report Status 04/02/2015 FINAL  Final  MRSA PCR Screening     Status: None   Collection Time: 03/30/15  4:40 PM  Result Value Ref Range Status   MRSA by PCR NEGATIVE NEGATIVE Final    Comment:        The GeneXpert MRSA Assay (FDA approved for NASAL specimens only), is one component of a comprehensive MRSA colonization surveillance program. It is not intended to diagnose MRSA infection nor to guide or monitor treatment for MRSA infections.      Medications:   . antiseptic oral rinse  7 mL Mouth Rinse q12n4p  . atorvastatin  20 mg Oral QHS  . benztropine  0.5 mg Oral BID  .  ceFAZolin (ANCEF) IV  1 g Intravenous Q12H  . chlorhexidine  15 mL Mouth/Throat BID  . divalproex  500 mg Oral BID  . enoxaparin (LOVENOX) injection  30 mg Subcutaneous Q24H  . escitalopram  10 mg Oral Daily  . feeding supplement (ENSURE ENLIVE)  237 mL Oral TID BM  . omega-3 acid  ethyl esters  2 g Oral BID  . pneumococcal 23 valent vaccine  0.5 mL Intramuscular Tomorrow-1000  . QUEtiapine  50 mg Oral BID   Continuous Infusions: . dextrose 150 mL/hr at 04/03/15 1007    Time spent: 25 minutes.  LOS: 4 days    Gary [name]  [facility] Pager [phone]. If unable to reach me by pager, please call my cell phone at [phone].  *Please refer to [email or web address], password [email or web address] to get updated schedule on who will round on this patient, as hospitalists switch teams weekly. If 7PM-7AM, please contact night-coverage at [email or web address], password [email or web address] for any overnight needs.  04/03/2015, 12:43 PM

## 2015-04-03 NOTE — Clinical Social Work Note (Signed)
Clinical Social Work Assessment  Patient Details  Name: Gary [name] MRN: [id number] Date of Birth: 11/22/1940  Date of referral:  04/03/15               Reason for consult:  Facility Placement                Permission sought to share information with:  Facility Industrial/product designerContact Representative Permission granted to share information::  Yes, Verbal Permission Granted  Name::        Agency::     Relationship::     Contact Information:     Housing/Transportation Living arrangements for the past 2 months:  Assisted Living Facility Source of Information:   (nephew, [name] ) Patient Interpreter Needed:  None Criminal Activity/Legal Involvement Pertinent to Current Situation/Hospitalization:  No - Comment as needed Significant Relationships:  Other Family Members Lives with:  Facility Resident Do you feel safe going back to the place where you live?  Yes Need for family participation in patient care:  Yes (Comment)  Care giving concerns:  CSW received consult that patient was admitted from ALF. CSW [place] [facility] [place] (ph#: [phone]).    Social Worker assessment / plan:  CSW spoke with med tech, Tabitha at ALF to confirm patient's baseline, he was able to feed himself though he needed assistance with dressing and bathroom. Per Wyatt Mageabitha, he used a rolling walker.   Employment status:  Retired Health and safety inspectornsurance information:  Armed forces operational officerMedicare, [place] Recommendations:  Not assessed at this time Information / Referral to community resources:     Patient/Family's Response to care:  Patient's nephew, [name] "Baldo AshCarl" confirmed that the plan is [place] [facility] [place], would prefer that he not go to a SNF if necessary so as not to confuse him. [facility] and has adapted to his surroundings well.   Patient/Family's Understanding of and Emotional Response to Diagnosis, Current  Treatment, and Prognosis:  Patient's nephew is aware of diagnosis and treatment.   Emotional Assessment Appearance:    Attitude/Demeanor/Rapport:    Affect (typically observed):    Orientation:  Oriented to Self Alcohol / Substance use:    Psych involvement (Current and /or in the community):     Discharge Needs  Concerns to be addressed:    Readmission within the last 30 days:    Current discharge risk:    Barriers to Discharge:      Arlyss RepressHarrison, Ormand Senn F, LCSW 04/03/2015, 3:27 PM

## 2015-04-03 NOTE — Evaluation (Addendum)
Clinical/Bedside Swallow Evaluation Patient Details  Name: Gary [name] MRN: [id number] Date of Birth: Aug 22, 1940  Today's Date: 04/03/2015 Time: SLP Start Time (ACUTE ONLY): 1515 SLP Stop Time (ACUTE ONLY): 1540 SLP Time Calculation (min) (ACUTE ONLY): 25 min  Past Medical History:  Past Medical History  Diagnosis Date  . Mental retardation   . Hypertension   . Osteoarthritis   . Hyperlipidemia   . Agitation    Past Surgical History:  Past Surgical History  Procedure Laterality Date  . Cataract extraction w/ intraocular lens  implant, bilateral     HPI:  75 yo male adm to [facility] with AMS, hypoxia, decreased BP - found to be septic and diagnosed with pna.  Pt has h/o MR, dysphagia with ED admit 12/2014 for choking on chicken and required chest compressions at that time, fall July 2016, pressure ulcer and ? Parkinson's (not on meds per MD note).  Swallow eval ordered, pt coughing with liquids but tolerating applesauce welll per order.  RN reports pt aspirating with thinner consistencies.     Assessment / Plan / Recommendation Clinical Impression  Pt presents with severe oral dysphagia secondary to his MR resulting in decreased oral control, lingual thrusting and presumed premature spillage of thinner boluses from posterior lingua into airway.  Lingual thrusting with all swallows noted despite use of straw and pt did not follow directions to seal lips with swallowing.  Pt tolerating honey thick liquids and puree much better than solids requiring mastication or thins.  Suspect increased visocity helps with oral coordination deficits as cohesive boluses slowly spill allowing time for reflexive swallow.     Skilled intervention included determining textures better managed by pt.  Hopeful for strong pharyngeal swallow as no indications of residuals noted but can not rule out pharyngeal - esophageal component.    Per chart review, pt had choking episode prompting ED admit in 12/2014 - when he  choked on chicken.  To mitigate aspiration risk and maximize swallow efficiency/safety, recommend to modify to dys1/honey and allow tsps of water between meals only after oral care and if tolerated.    Reviewed recommendations with RN.  Given pt had recent choking epsiode and current pna, he would benefit from instrumental swallow evaluation.  Hopeful to complete next date.      Aspiration Risk  Severe aspiration risk;Moderate aspiration risk    Diet Recommendation Dysphagia 1 (Puree);Honey-thick liquid (tsps thin water between meals after oral care)   Liquid Administration via: Cup;No straw Medication Administration: Crushed with puree Supervision: Full supervision/cueing for compensatory strategies Compensations: Slow rate;Small sips/bites (delayed swallow, assure pt swallows before giving more) Postural Changes: Seated upright at 90 degrees;Remain upright for at least 30 minutes after po intake    Other  Recommendations Oral Care Recommendations:  (oral care after meals) Other Recommendations: Have oral suction available;Order thickener from pharmacy   Follow up Recommendations  Other (comment) (TBD)    Frequency and Duration min 2x/week  2 weeks       Prognosis Prognosis for Safe Diet Advancement: Guarded Barriers to Reach Goals: Severity of deficits;Cognitive deficits      Swallow Study   General Date of Onset: 04/03/15 HPI: 75 yo male adm to [facility] with AMS, hypoxia, decreased BP - found to be septic and diagnosed with pna.  Pt has h/o MR, dysphagia with ED admit 12/2014 for choking on chicken and required chest compressions at that time, fall July 2016, pressure ulcer and ? Parkinson's (not on meds per MD  note).  Swallow eval ordered, pt coughing with liquids but tolerating applesauce welll per order.  RN reports pt aspirating with thinner consistencies.   Type of Study: Bedside Swallow Evaluation Diet Prior to this Study: Dysphagia 3 (soft);Thin liquids Temperature Spikes  Noted: No Respiratory Status: Room air History of Recent Intubation: No Behavior/Cognition: Alert;Cooperative;Pleasant mood;Distractible (decreased ability to follow commands) Oral Cavity Assessment: Within Functional Limits Oral Care Completed by SLP: No Oral Cavity - Dentition: Edentulous Vision:  (unknown but pt requires physical assistance to eat) Self-Feeding Abilities: Total assist Patient Positioning: Upright in bed Baseline Vocal Quality: Low vocal intensity Volitional Cough: Cognitively unable to elicit Volitional Swallow: Unable to elicit    Oral/Motor/Sensory Function Overall Oral Motor/Sensory Function:  (lingual deviation to left upon extension, pt did not follow directions for OME)   Ice Chips Ice chips: Not tested Other Comments: due to excessive lingual movement/thrusting and aspiration risk   Thin Liquid Thin Liquid: Impaired Presentation: Spoon Oral Phase Impairments: Reduced lingual movement/coordination;Reduced labial seal Oral Phase Functional Implications: Prolonged oral transit Other Comments: lingual thrusting with swallowing, pt swallows with tongue protruded and does not follow commands to seal lips     Nectar Thick Nectar Thick Liquid: Impaired Presentation: Cup;Spoon;Straw Oral Phase Impairments: Reduced lingual movement/coordination;Reduced labial seal Oral phase functional implications: Prolonged oral transit Pharyngeal Phase Impairments: Cough - Delayed;Cough - Immediate Other Comments: lingual thrusting with swallowing, pt swallows with tongue protruded and does not follow commands to seal lips    Honey Thick Honey Thick Liquid: Impaired Presentation: Spoon;Cup Oral Phase Impairments: Reduced labial seal Oral Phase Functional Implications: Prolonged oral transit Other Comments: lingual thrusting with swallowing, pt swallows with tongue protruded and does not follow commands to seal lips    Puree Puree: Impaired Presentation: Spoon Oral Phase  Impairments: Reduced labial seal;Reduced lingual movement/coordination Oral Phase Functional Implications: Prolonged oral transit Other Comments: lingual thrusting with swallowing, pt swallows with tongue protruded and does not follow commands to seal lips    Solid   GO    Solid: Impaired Oral Phase Impairments: Reduced lingual movement/coordination;Reduced labial seal Oral Phase Functional Implications: Impaired mastication;Prolonged oral transit Other Comments: lingual thrusting with swallowing, pt swallows with tongue protruded and does not follow commands to seal lips        Mickie BailKimball, Terance Pomplun Ann Rodrigo Mcgranahan AristesKimball, [place] [facility] SLP [phone]

## 2015-04-04 ENCOUNTER — Inpatient Hospital Stay (HOSPITAL_COMMUNITY): Payer: Medicare Other

## 2015-04-04 DIAGNOSIS — D696 Thrombocytopenia, unspecified: Secondary | ICD-10-CM | POA: Diagnosis present

## 2015-04-04 LAB — BASIC METABOLIC PANEL
ANION GAP: 6 (ref 5–15)
BUN: 73 mg/dL — AB (ref 6–20)
CO2: 23 mmol/L (ref 22–32)
Calcium: 7.9 mg/dL — ABNORMAL LOW (ref 8.9–10.3)
Chloride: 115 mmol/L — ABNORMAL HIGH (ref 101–111)
Creatinine, Ser: 2.31 mg/dL — ABNORMAL HIGH (ref 0.61–1.24)
GFR calc Af Amer: 31 mL/min — ABNORMAL LOW (ref >60)
GFR, EST NON AFRICAN AMERICAN: 27 mL/min — AB (ref >60)
GLUCOSE: 123 mg/dL — AB (ref 65–99)
POTASSIUM: 4.5 mmol/L (ref 3.5–5.1)
SODIUM: 144 mmol/L (ref 135–145)

## 2015-04-04 LAB — CBC
HEMATOCRIT: 38.6 % — AB (ref 39.0–52.0)
HEMOGLOBIN: 12.3 g/dL — AB (ref 13.0–17.0)
MCH: 31.5 pg (ref 26.0–34.0)
MCHC: 31.9 g/dL (ref 30.0–36.0)
MCV: 98.7 fL (ref 78.0–100.0)
Platelets: 49 10*3/uL — ABNORMAL LOW (ref 150–400)
RBC: 3.91 MIL/uL — ABNORMAL LOW (ref 4.22–5.81)
RDW: 15.9 % — AB (ref 11.5–15.5)
WBC: 14.7 10*3/uL — ABNORMAL HIGH (ref 4.0–10.5)

## 2015-04-04 MED ORDER — ALBUTEROL SULFATE (2.5 MG/3ML) 0.083% IN NEBU
2.5000 mg | INHALATION_SOLUTION | RESPIRATORY_TRACT | Status: DC | PRN
  Administered 2015-04-04 – 2015-04-06 (×2): 2.5 mg via RESPIRATORY_TRACT
  Filled 2015-04-04 (×2): qty 3

## 2015-04-04 NOTE — Progress Notes (Addendum)
  CHL IP CLINICAL IMPRESSIONS 04/04/2015  Therapy Diagnosis Moderate oral phase dysphagia;Moderate pharyngeal phase dysphagia;Mild cervical esophageal phase dysphagia  Clinical Impression Moderate oropharyngeal and mild cervical esophageal dysphagia noted.  Pt with baseline congested cough prior to barium administeration - causing SLP to suspect secretion aspiration.  Oral transiting is ill controlled with pt essentially dumping food/drink into pharynx - oral transiting is very rapid.  Mild tongue base and pharyngeal residuals noted without pt awareness and unfortunately pt did not dry swallow reflexive or on command to faciliate clearance.  Pt can cough per command but cough was not productive.   Pt did aspirate with thin via cup with cough response that was not productive.  Aspiration occured due to spillage into larynx before swallow triggering.    Very poor "mastication" of moist cracker noted with pt essentially swallowing cracker whole. Cracker lodged at UES without pt sensation- pudding aided clearance.  Thicker consistencies compensate for ill oral control but do result in pharyngeal residuals pt does not sense.   Pt will be chornic aspiration risk regardless of consistency and SLP suspect pt is aspirating secretions and therefore option may be to allow pt diet for comfort with precautions.    Use of modified diet may mitigate aspiration but will not be preventative.  SLP to follow up.    Of note, called nurse Victorino DikeJennifer and requested respiratory therapy see pt if indicated - as at testing became wheezy after aspiration episode.   Impact on safety and function Severe aspiration risk     CHL IP TREATMENT RECOMMENDATION 04/04/2015  Treatment Recommendations Therapy as outlined in treatment plan below;F/U MBS in --- days (Comment)    Prognosis 04/04/2015  Prognosis for Safe Diet Advancement Guarded  Barriers to Reach Goals Severity of deficits;Cognitive deficits  Barriers/Prognosis Comment --     CHL IP DIET RECOMMENDATION 04/04/2015  SLP Diet Recommendations NPO- vs po with accepted risks   Liquid Administration via Cup;No straw;Spoon  Medication Administration Crushed with puree  Compensations Slow rate;Small sips/bites  Postural Changes --     CHL IP OTHER RECOMMENDATIONS 04/04/2015  Recommended Consults --  Oral Care Recommendations Oral care BID  Other Recommendations Have oral suction available;Order thickener from pharmacy     Gary Burnetamara Zakiyyah Savannah, MS [facility] SLP [phone]

## 2015-04-04 NOTE — Care Management Important Message (Signed)
Important Message  Patient Details  Name: Laure KidneyRoy Clair Victory MRN: [id number] Date of Birth: 05/14/1940   Medicare Important Message Given:  Yes    Haskell FlirtJamison, Kameran Mcneese 04/04/2015, 2:55 PMImportant Message  Patient Details  Name: Laure KidneyRoy Clair Chasen MRN: [id number] Date of Birth: 12/09/1940   Medicare Important Message Given:  Yes    Haskell FlirtJamison, Alcee Sipos 04/04/2015, 2:55 PM

## 2015-04-04 NOTE — NC FL2 (Signed)
[place] MEDICAID FL2 LEVEL OF CARE SCREENING TOOL     IDENTIFICATION  Patient Name: Gary [name] Birthdate: 04/22/1940 Sex: male Admission Date (Current Location): 03/30/2015  [facility] [place]   [facility] Address[facility]  [facility],  [address], [place] [id number]      Provider Number: [id number]  Attending Physician Name and Address:  Maryruth Bunhristina P Rama, MD  Relative Name and Phone Number:       Current Level of Care: Hospital Recommended Level of Care: Skilled Nursing Facility Prior Approval Number:    Date Approved/Denied:   PASRR Number:   [id number]  Discharge Plan: SNF    Current Diagnoses: Patient Active Problem List   Diagnosis Date Noted  . Thrombocytopenia (HCC) 04/04/2015  . Hypernatremia 04/02/2015  . Pressure ulcer 03/31/2015  . Bacteremia 03/31/2015  . Hospital-acquired pneumonia 03/30/2015  . Sepsis (HCC) 03/30/2015  . AKI (acute kidney injury) (HCC) 03/30/2015  . Mental retardation   . Hypertension   . Hyperlipidemia   . Parkinson's disease (HCC)     Orientation RESPIRATION BLADDER Height & Weight    Self  Normal Incontinent, Indwelling catheter 5\' 5"  (165.1 cm) 137 lbs.  BEHAVIORAL SYMPTOMS/MOOD NEUROLOGICAL BOWEL NUTRITION STATUS      Continent Diet (dys-1)  AMBULATORY STATUS COMMUNICATION OF NEEDS Skin   Limited Assist Verbally  (PressureUlcer12/29/16StageI-Intactskinwithnon-blanchablerednessofalocalizedareausuallyoverabonyprominence.reddenednonblanchableareatosacrum)                       Personal Care Assistance Level of Assistance  Bathing, Feeding, Dressing Bathing Assistance: Limited assistance Feeding assistance: Limited assistance Dressing Assistance: Limited assistance     Functional Limitations Info             SPECIAL CARE FACTORS FREQUENCY  PT (By licensed PT), OT (By licensed OT)     PT Frequency: 5 OT Frequency: 5             Contractures      Additional Factors Info  Code Status, Allergies Code Status Info: Fullcode Allergies Info: NDKA           Current Medications (04/04/2015):  This is the current hospital active medication list Current Facility-Administered Medications  Medication Dose Route Frequency Provider Last Rate Last Dose  . acetaminophen (TYLENOL) tablet 650 mg  650 mg Oral Q4H PRN Maryruth Bunhristina P Rama, MD   650 mg at 04/01/15 1639  . antiseptic oral rinse (CPC / CETYLPYRIDINIUM CHLORIDE 0.05%) solution 7 mL  7 mL Mouth Rinse q12n4p Christina P Rama, MD   7 mL at 04/04/15 1200  . atorvastatin (LIPITOR) tablet 20 mg  20 mg Oral QHS Maryruth Bunhristina P Rama, MD   20 mg at 04/03/15 2158  . benztropine (COGENTIN) tablet 0.5 mg  0.5 mg Oral BID Maryruth Bunhristina P Rama, MD   0.5 mg at 04/04/15 1043  . ceFAZolin (ANCEF) IVPB 1 g/50 mL premix  1 g Intravenous Q12H Maryruth Bunhristina P Rama, MD   1 g at 04/04/15 1044  . chlorhexidine (PERIDEX) 0.12 % solution 15 mL  15 mL Mouth/Throat BID Maryruth Bunhristina P Rama, MD   15 mL at 04/04/15 1044  . dextrose 5 % solution   Intravenous Continuous Maryruth Bunhristina P Rama, MD 150 mL/hr at 04/03/15 1007    . divalproex (DEPAKOTE) DR tablet 500 mg  500 mg Oral BID Maryruth Bunhristina P Rama, MD   500 mg at 04/04/15 1042  . escitalopram (LEXAPRO) tablet 10 mg  10 mg Oral Daily Maryruth Bunhristina P Rama, MD  10 mg at 04/04/15 1044  . feeding supplement (ENSURE ENLIVE) (ENSURE ENLIVE) liquid 237 mL  237 mL Oral TID BM Maryruth Bun Rama, MD   237 mL at 04/03/15 1930  . guaiFENesin-dextromethorphan (ROBITUSSIN DM) 100-10 MG/5ML syrup 5 mL  5 mL Oral Q4H PRN Christina P Rama, MD      . hydrOXYzine (ATARAX/VISTARIL) tablet 25 mg  25 mg Oral Q4H PRN Maryruth Bun Rama, MD      . LORazepam (ATIVAN) tablet 0.5 mg  0.5 mg Oral Q12H PRN Maryruth Bun Rama, MD   0.5 mg at 04/03/15 2158  . omega-3 acid ethyl esters (LOVAZA) capsule 2 g  2 g Oral BID Maryruth Bun Rama, MD   2 g at 04/04/15 1042  . pneumococcal 23 valent vaccine (PNU-IMMUNE)  injection 0.5 mL  0.5 mL Intramuscular Tomorrow-1000 Christina P Rama, MD   0.5 mL at 04/01/15 1000  . QUEtiapine (SEROQUEL) tablet 50 mg  50 mg Oral BID Maryruth Bun Rama, MD   50 mg at 04/04/15 1043     Discharge Medications: Please see discharge summary for a list of discharge medications.  Relevant Imaging Results:  Relevant Lab Results:   Additional Information SSN: [id number]  Arlyss Repress, LCSW

## 2015-04-04 NOTE — Progress Notes (Addendum)
SLP recommends to conduct MBS given pt h/o dysphagia, current pna and MR.  Awaiting information from xray re: size of flouroscopy space to determine if pt's size will allow MBS.  Will follow up.   Received order for MBS, thank you.    Spoke to RN who reports pt with increased respiratory difficulties today associated with eating - including diaphragmatic breathing, SLP saw pt and he aroused with clear voice stating "I'm okay".  Suspect chronicity of aspiration.   MBS planned for approximately 1330 today, given RN report of worsening respiratory status with po- would recommend npo except oral moisture pending MBS.   Informed RN of plan.   Sarra Rachels, MS [facility] SLP [phone]

## 2015-04-04 NOTE — Progress Notes (Signed)
Progress Note   Fed Ceci XLK:[id number] DOB: 02-Dec-1940 DOA: 03/30/2015 PCP: Cheral Bay, MD   Brief Narrative:   Gary [name] is an 75 y.o. male with a PMH of mental retardation, from [facility], who was sent to the ED for evaluation of altered mental status, hypoxia and low BP. Upon initial evaluation in the ED, he was found to be febrile, hypotensive, and with AKI. CXR shows pneumonia.  Assessment/Plan:   Principal Problem:  Sepsis (HCC) secondary to gram-negative rod bacteremia secondary to either HCAP or UTI - This patient felt to be at high risk of poor outcomes with a SOFA score of 2-3. Admitted to SDU. - Source thought to be from lungs with pneumonia findings on CXR. U/A shows turbid urine with moderate WBC. - Preliminary blood cultures growing GNR in both bottles. Urine cultures growing Escherichia coli. - Pro calcitonin and lactic acid both elevated on admission. Lactate has cleared. - Fluid volume resuscitated with 30 mg/kg using weight based algorithm per sepsis order set (3 Liters given). - Antibiotics narrowed to Cefepime 03/31/15 given GNR bacteremia. - Blood pressure improved status post fluid volume resuscitation. - Slowly improving, try to mobilize. - Speech therapy evaluation/modified barium swallow.  Active Problems:   Thrombocytopenia - Discontinue Lovenox and check H IT panel.    Hyperkalemia - Possibly a hemolyzed specimen versus related to acute [name] injury. WNL on recheck.   AKI / hypernatremia - Secondary to sepsis. Baseline creatinine is 0.9.  Creatinine remains elevated over usual baseline values. - Continue hypotonic IVF.   Mental retardation with behavioral disturbance - Continue Depakote, Lexapro, Ativan PRN and Seroquel. - Depakote level therapeutic.   Hyperlipidemia - Continue statin/fish oil.   Parkinson's disease (HCC) - Unclear dx.Nephew not aware of this diagnosis. Not on meds for this. PT  eval when stable.    Pressure ulcer - Present on admission. Skin care per wound care nurses recommendations.    DVT Prophylaxis - Lovenox discontinued secondary to thrombocytopenia, start SCDs.   Family Communication/Anticipated D/C date and plan/Code Status   Family Communication: Nephew at bedside 03/31/15. 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 Anticipated D/C date:   Likely tomorrow if mental status improves. Still a bit somnolent. Code Status: Full code.   IV Access:    Peripheral IV   Procedures and diagnostic studies:   Dg Chest 2 View  03/30/2015  CLINICAL DATA:  Altered mental status.  Abnormal lung exam. EXAM: CHEST  2 VIEW COMPARISON:  12/17/2014 and 12/07/2014 FINDINGS: Artifact overlies chest. Heart size is normal. Mediastinal shadows are normal. There is patchy density in both lower lobes consistent with atelectasis and basilar pneumonia. Upper lungs are clear. No effusions. No acute bone finding. IMPRESSION: Abnormal patchy density in both lower lobes consistent with pneumonia. Electronically Signed   By: Paulina Fusi M.D.   On: 03/30/2015 13:42   Dg Chest Port 1 View  04/01/2015  CLINICAL DATA:  Short of breath.  Dyspnea.  Mental retardation. EXAM: PORTABLE CHEST 1 VIEW COMPARISON:  03/30/2015 FINDINGS: Minimally motion degraded AP portable view. Patient rotated left. Cardiomegaly accentuated by AP portable technique. Possible small left pleural effusion. No pneumothorax. Low lung volumes with resultant pulmonary interstitial prominence. Progression of left worse than right bibasilar airspace disease. IMPRESSION: Cardiomegaly and low lung volumes. Progressive bibasilar airspace disease, suspicious for infection or aspiration. Electronically Signed   By: Jeronimo Greaves M.D.   On: 04/01/2015 14:58    Medical Consultants:  None.  Anti-Infectives:    Vancomycin 03/30/15--->03/31/15  Cefepime 03/30/15--->   Subjective:   Gary [name]  is waking up a bit. He is unable to provide much detail about his current symptoms.  Objective:    Filed Vitals:   04/03/15 1504 04/03/15 2032 04/04/15 0526 04/04/15 1455  BP: 165/62 144/97 147/61 140/68  Pulse: 72 71 62 67  Temp: 98.2 F (36.8 C) 97.9 F (36.6 C) 97 F (36.1 C) 98.4 F (36.9 C)  TempSrc: Oral Axillary Axillary Oral  Resp: 18 18 18 18   Height:      Weight:      SpO2: 98% 97% 97% 98%    Intake/Output Summary (Last 24 hours) at 04/04/15 1505 Last data filed at 04/04/15 1318  Gross per 24 hour  Intake   2190 ml  Output   1400 ml  Net    790 ml   Filed Weights   03/30/15 1327 03/31/15 0400  Weight: 68.04 kg (150 lb) 62.4 kg (137 lb 9.1 oz)   Gen: NAD, resting comfortably Cardiovascular: RRR,  Faint murmur of AS noted Respiratory: Lungs diminished Gastrointestinal: Abdomen soft, NT/ND, + BS Extremities: No C/E/C   Data Reviewed:    Labs: Basic Metabolic Panel:  Recent Labs Lab 03/31/15 0355  04/01/15 0335 04/02/15 0342 04/03/15 0600 04/04/15 0516  NA 141  --  146* 146* 147* 144  K 5.6*  < > 4.6 4.5 4.5 4.5  CL 106  --  116* 117* 117* 115*  CO2 27  --  21* 21* 24 23  GLUCOSE 86  --  84 93 90 123*  BUN 71*  --  76* 74* 77* 73*  CREATININE 3.03*  --  2.44* 2.47* 2.33* 2.31*  CALCIUM 7.6*  --  7.7* 7.5* 7.9* 7.9*  < > = values in this interval not displayed. GFR Estimated Creatinine Clearance: 25.1 mL/min (by C-G formula based on Cr of 2.31). Liver Function Tests:  Recent Labs Lab 03/30/15 1342  AST 35  ALT 24  ALKPHOS 74  BILITOT 0.7  PROT 6.1*  ALBUMIN 2.7*   Coagulation profile  Recent Labs Lab 03/30/15 1330  INR 1.24    CBC:  Recent Labs Lab 03/30/15 1342 03/31/15 0355 04/01/15 0335 04/02/15 0342 04/04/15 0516  WBC 15.5* 16.5* 11.9* 12.9* 14.7*  NEUTROABS 14.4*  --   --   --   --   HGB 12.7* 11.3* 11.7* 11.6* 12.3*  HCT 37.5* 35.0* 35.0* 36.2* 38.6*  MCV 95.4 98.9 95.1 96.3 98.7  PLT 159 155 144* 106*  49*   CBG:  Recent Labs Lab 03/30/15 1316  GLUCAP 104*   Sepsis Labs:  Recent Labs Lab 03/30/15 1401 03/30/15 1812 03/31/15 0355 03/31/15 0755 04/01/15 0335 04/02/15 0342 04/04/15 0516  PROCALCITON  --  3.35  --   --   --   --   --   WBC  --   --  16.5*  --  11.9* 12.9* 14.7*  LATICACIDVEN 2.69*  --   --  1.4  --   --   --    Microbiology Recent Results (from the past 240 hour(s))  Urine culture     Status: None   Collection Time: 03/30/15  1:31 PM  Result Value Ref Range Status   Specimen Description URINE, CATHETERIZED  Final   Special Requests NONE  Final   Culture   Final    >=100,000 COLONIES/mL ESCHERICHIA COLI Performed at [facility]    Report  Status 04/01/2015 FINAL  Final   Organism ID, Bacteria ESCHERICHIA COLI  Final      Susceptibility   Escherichia coli - MIC*    AMPICILLIN <=2 SENSITIVE Sensitive     CEFAZOLIN <=4 SENSITIVE Sensitive     CEFTRIAXONE <=1 SENSITIVE Sensitive     CIPROFLOXACIN <=0.25 SENSITIVE Sensitive     GENTAMICIN 2 SENSITIVE Sensitive     IMIPENEM <=0.25 SENSITIVE Sensitive     NITROFURANTOIN <=16 SENSITIVE Sensitive     TRIMETH/SULFA <=20 SENSITIVE Sensitive     AMPICILLIN/SULBACTAM <=2 SENSITIVE Sensitive     PIP/TAZO <=4 SENSITIVE Sensitive     * >=100,000 COLONIES/mL ESCHERICHIA COLI  Culture, blood (routine x 2)     Status: None   Collection Time: 03/30/15  1:40 PM  Result Value Ref Range Status   Specimen Description BLOOD LEFT WRIST  Final   Special Requests BOTTLES DRAWN AEROBIC AND ANAEROBIC 5 CC EA  Final   Culture  Setup Time   Final    GRAM NEGATIVE RODS IN BOTH AEROBIC AND ANAEROBIC BOTTLES CRITICAL RESULT CALLED TO, READ BACK BY AND VERIFIED WITH: A KOONTZ 0727 03/31/15 MKELLY    Culture   Final    ESCHERICHIA COLI [facility]    Report Status 04/02/2015 FINAL  Final   Organism ID, Bacteria ESCHERICHIA COLI  Final      Susceptibility   Escherichia coli - MIC*    AMPICILLIN  <=2 SENSITIVE Sensitive     CEFAZOLIN <=4 SENSITIVE Sensitive     CEFEPIME <=1 SENSITIVE Sensitive     CEFTAZIDIME <=1 SENSITIVE Sensitive     CEFTRIAXONE <=1 SENSITIVE Sensitive     CIPROFLOXACIN <=0.25 SENSITIVE Sensitive     GENTAMICIN <=1 SENSITIVE Sensitive     IMIPENEM <=0.25 SENSITIVE Sensitive     TRIMETH/SULFA <=20 SENSITIVE Sensitive     AMPICILLIN/SULBACTAM <=2 SENSITIVE Sensitive     PIP/TAZO <=4 SENSITIVE Sensitive     * ESCHERICHIA COLI  Culture, blood (routine x 2)     Status: None   Collection Time: 03/30/15  1:50 PM  Result Value Ref Range Status   Specimen Description BLOOD LEFT FOREARM  Final   Special Requests BOTTLES DRAWN AEROBIC AND ANAEROBIC 5 CC EA  Final   Culture  Setup Time   Final    GRAM NEGATIVE RODS IN BOTH AEROBIC AND ANAEROBIC BOTTLES CRITICAL RESULT CALLED TO, READ BACK BY AND VERIFIED WITH: A. KOONTZ @0729  03/31/15 MKELLY    Culture   Final    ESCHERICHIA COLI SUSCEPTIBILITIES PERFORMED ON PREVIOUS CULTURE WITHIN THE LAST 5 DAYS. Performed [facility]    Report Status 04/02/2015 FINAL  Final  MRSA PCR Screening     Status: None   Collection Time: 03/30/15  4:40 PM  Result Value Ref Range Status   MRSA by PCR NEGATIVE NEGATIVE Final    Comment:        The GeneXpert MRSA Assay (FDA approved for NASAL specimens only), is one component of a comprehensive MRSA colonization surveillance program. It is not intended to diagnose MRSA infection nor to guide or monitor treatment for MRSA infections.      Medications:   . antiseptic oral rinse  7 mL Mouth Rinse q12n4p  . atorvastatin  20 mg Oral QHS  . benztropine  0.5 mg Oral BID  .  ceFAZolin (ANCEF) IV  1 g Intravenous Q12H  . chlorhexidine  15 mL Mouth/Throat BID  . divalproex  500  mg Oral BID  . escitalopram  10 mg Oral Daily  . feeding supplement (ENSURE ENLIVE)  237 mL Oral TID BM  . omega-3 acid ethyl esters  2 g Oral BID  . pneumococcal 23 valent vaccine  0.5 mL  Intramuscular Tomorrow-1000  . QUEtiapine  50 mg Oral BID   Continuous Infusions: . dextrose 150 mL/hr at 04/03/15 1007    Time spent: 25 minutes.  LOS: 5 days   RAMA,CHRISTINA  [facility] Pager [phone]. If unable to reach me by pager, please call my cell phone at [phone].  *Please refer to [email or web address], password [email or web address] to get updated schedule on who will round on this patient, as hospitalists switch teams weekly. If 7PM-7AM, please contact night-coverage at [email or web address], password [email or web address] for any overnight needs.  04/04/2015, 3:05 PM

## 2015-04-04 NOTE — Evaluation (Signed)
Physical Therapy Evaluation Patient Details Name: Gary [name] MRN: [id number] DOB: 07/23/1940 Today's Date: 04/04/2015   History of Present Illness  75 yo male admitted with sepsis. Hx of mental retardation, HTN, Parkinson's. Pt is from ALF  Clinical Impression  On eval, pt required total assist +2 for bed mobility and attempts at standing. Attempted sit to stand x3 with RW-pt barely able to clear bottom from bed surface. Noted jerking in bil UEs and LEs. Bil knee buckling with each attempt to stand. No family present during session. Recommend SNF.     Follow Up Recommendations SNF    Equipment Recommendations  None recommended by PT    Recommendations for Other Services       Precautions / Restrictions Precautions Precautions: Fall Restrictions Weight Bearing Restrictions: No      Mobility  Bed Mobility Overal bed mobility: Needs Assistance Bed Mobility: Supine to Sit;Sit to Supine     Supine to sit: Total assist;+2 for physical assistance;+2 for safety/equipment Sit to supine: Total assist;+2 for safety/equipment;+2 for physical assistance   General bed mobility comments: Assist for trunk and bil LEs. Utilized bedpad for positioning at EOB.   Transfers Overall transfer level: Needs assistance Equipment used: Rolling walker (2 wheeled) Transfers: Sit to/from Stand Sit to Stand: Total assist;+2 physical assistance;+2 safety/equipment         General transfer comment: Sit to stand x 3 with RW. Pt barely able to clear bottom from bed surface. Bil LE buckling.   Ambulation/Gait             General Gait Details: NT-pt unable at this time  Stairs            Wheelchair Mobility    Modified Rankin (Stroke Patients Only)       Balance Overall balance assessment: Needs assistance Sitting-balance support: Feet supported;Bilateral upper extremity supported Sitting balance-Leahy Scale: Poor Sitting balance - Comments: Mod-Max assist for static  sitting balance initially. Progressed to Min assist but with repeated LOB anteriorly. Pt could not keep UEs/hands on walker-repeatedly fell off.                                     Pertinent Vitals/Pain Pain Assessment: No/denies pain    Home Living Family/patient expects to be discharged to:: Skilled nursing facility     Type of Home: Assisted living                Prior Function Level of Independence: Independent with assistive device(s)         Comments: ambulatory with RW per RN/chart     Hand Dominance        Extremity/Trunk Assessment   Upper Extremity Assessment: Difficult to assess due to impaired cognition           Lower Extremity Assessment: Difficult to assess due to impaired cognition      Cervical / Trunk Assessment: Kyphotic  Communication   Communication: Expressive difficulties;Receptive difficulties  Cognition Arousal/Alertness: Awake/alert Behavior During Therapy: WFL for tasks assessed/performed Overall Cognitive Status: History of cognitive impairments - at baseline                      General Comments      Exercises        Assessment/Plan    PT Assessment Patient needs continued PT services  PT Diagnosis Difficulty walking;Generalized weakness   PT Problem  List Decreased strength;Decreased activity tolerance;Decreased balance;Decreased mobility;Decreased coordination;Decreased cognition;Decreased knowledge of use of DME  PT Treatment Interventions DME instruction;Gait training;Functional mobility training;Therapeutic activities;Patient/family education;Balance training;Therapeutic exercise   PT Goals (Current goals can be found in the Care Plan section) Acute Rehab PT Goals Patient Stated Goal: none stated-pt unable. family would like pt to be able to return to snf PT Goal Formulation: Patient unable to participate in goal setting Time For Goal Achievement: 04/18/15 Potential to Achieve Goals:  Fair    Frequency Min 3X/week   Barriers to discharge        Co-evaluation               End of Session Equipment Utilized During Treatment: Gait belt Activity Tolerance: Patient tolerated treatment well Patient left: in bed;with call bell/phone within reach;with bed alarm set           Time: 1038-1101 PT Time Calculation (min) (ACUTE ONLY): 23 min   Charges:   PT Evaluation $PT Eval Low Complexity: 1 Procedure PT Treatments $Therapeutic Activity: 8-22 mins   PT G Codes:        Rebeca Alert, MPT Pager: [phone]

## 2015-04-04 NOTE — Progress Notes (Signed)
Pt presents with diaphragmatic breathing while eating. O2 sat 96% on Room air and pt shows no distress when resting. Will continue to monitor.

## 2015-04-05 DIAGNOSIS — B9620 Unspecified Escherichia coli [E. coli] as the cause of diseases classified elsewhere: Secondary | ICD-10-CM | POA: Diagnosis present

## 2015-04-05 DIAGNOSIS — J189 Pneumonia, unspecified organism: Secondary | ICD-10-CM

## 2015-04-05 DIAGNOSIS — F79 Unspecified intellectual disabilities: Secondary | ICD-10-CM

## 2015-04-05 DIAGNOSIS — D696 Thrombocytopenia, unspecified: Secondary | ICD-10-CM

## 2015-04-05 DIAGNOSIS — N179 Acute kidney failure, unspecified: Secondary | ICD-10-CM | POA: Insufficient documentation

## 2015-04-05 DIAGNOSIS — N390 Urinary tract infection, site not specified: Secondary | ICD-10-CM | POA: Diagnosis present

## 2015-04-05 DIAGNOSIS — J690 Pneumonitis due to inhalation of food and vomit: Secondary | ICD-10-CM

## 2015-04-05 DIAGNOSIS — R8290 Unspecified abnormal findings in urine: Secondary | ICD-10-CM

## 2015-04-05 LAB — CBC
HCT: 37.7 % — ABNORMAL LOW (ref 39.0–52.0)
HEMOGLOBIN: 12 g/dL — AB (ref 13.0–17.0)
MCH: 31.4 pg (ref 26.0–34.0)
MCHC: 31.8 g/dL (ref 30.0–36.0)
MCV: 98.7 fL (ref 78.0–100.0)
PLATELETS: 64 10*3/uL — AB (ref 150–400)
RBC: 3.82 MIL/uL — AB (ref 4.22–5.81)
RDW: 15.8 % — ABNORMAL HIGH (ref 11.5–15.5)
WBC: 14.7 10*3/uL — AB (ref 4.0–10.5)

## 2015-04-05 LAB — BASIC METABOLIC PANEL
ANION GAP: 9 (ref 5–15)
BUN: 60 mg/dL — AB (ref 6–20)
CHLORIDE: 112 mmol/L — AB (ref 101–111)
CO2: 24 mmol/L (ref 22–32)
Calcium: 8.1 mg/dL — ABNORMAL LOW (ref 8.9–10.3)
Creatinine, Ser: 2.05 mg/dL — ABNORMAL HIGH (ref 0.61–1.24)
GFR, EST AFRICAN AMERICAN: 36 mL/min — AB (ref >60)
GFR, EST NON AFRICAN AMERICAN: 31 mL/min — AB (ref >60)
Glucose, Bld: 90 mg/dL (ref 65–99)
POTASSIUM: 4.4 mmol/L (ref 3.5–5.1)
SODIUM: 145 mmol/L (ref 135–145)

## 2015-04-05 LAB — HEPARIN INDUCED PLATELET AB (HIT ANTIBODY): HEPARIN INDUCED PLT AB: 0.371 {OD_unit} (ref 0.000–0.400)

## 2015-04-05 MED ORDER — DEXTROSE 5 % IV SOLN
2.0000 g | INTRAVENOUS | Status: DC
  Administered 2015-04-05 – 2015-04-06 (×2): 2 g via INTRAVENOUS
  Filled 2015-04-05 (×2): qty 2

## 2015-04-05 NOTE — Progress Notes (Signed)
Patient appears anxious during meal with nurse tech feeding him.  Respirations increased, HOB elevated, cough worsened.  Oxygen sat 94%.  Patient completed meal and ate well.  Patient still restless, turned himself sideways in bed.  Patient repositioned and Ativan given.  Patient appears stable at this time.

## 2015-04-05 NOTE — Consult Note (Signed)
[facility]  Total days of antibiotics 7        Day 1 ceftriaxone               Reason for Consult: e.coli bacteremia/uti and aspiration pna   Referring Physician: Grandville Silos  Principal Problem:   Sepsis ([place]) Active Problems:   Hospital-acquired pneumonia   Mental retardation   Hyperlipidemia   Parkinson's disease ([place])   AKI (acute kidney injury) ([place])   Pressure ulcer   E coli bacteremia   Hypernatremia   Thrombocytopenia ([facility])   E. coli UTI    HPI: Gary [name] is a 75 y.o. male  with a PMH of mental retardation who was sent to the ED for evaluation of altered mental status, hypoxia and low BP.  According to his chart, RN at his facility, he was in his usual state of health until this morning, when he appeared to be and unable to feed himself, he subsequently had a coughing spell while eating resulting in respiratory distress which prompted her to call EMS. in the ED, he was found to be febrile, hypotensive, and with AKI. CXR shows bilateral infiltrates concerning for aspiration pneumonia/pneumonitis. He was given ivf and started on empiric regimen. His blood cx grew pansensitive ecoli as well as  Urine cx, (though urine specimen had many squamous cells). Since beeon on abtx, his wbc improved and now afebrile, though WBC at 14.7K slow to normalize.  Past Medical History  Diagnosis Date  . Mental retardation   . Hypertension   . Osteoarthritis   . Hyperlipidemia   . Agitation     Allergies: No Known Allergies   MEDICATIONS: . antiseptic oral rinse  7 mL Mouth Rinse q12n4p  . atorvastatin  20 mg Oral QHS  . benztropine  0.5 mg Oral BID  . cefTRIAXone (ROCEPHIN)  IV  2 g Intravenous Q24H  . chlorhexidine  15 mL Mouth/Throat BID  . divalproex  500 mg Oral BID  . escitalopram  10 mg Oral Daily  . feeding supplement (ENSURE ENLIVE)  237 mL Oral TID BM  . omega-3 acid ethyl esters  2 g Oral BID  . pneumococcal 23 valent vaccine  0.5 mL  Intramuscular Tomorrow-1000  . QUEtiapine  50 mg Oral BID    Social History  Substance Use Topics  . Smoking status: Never Smoker   . Smokeless tobacco: None  . Alcohol Use: No    No family history on file.  Review of Systems -  Unable to answer due to baseline cognitive impairment  OBJECTIVE: Temp:  [98.1 F (36.7 C)-98.7 F (37.1 C)] 98.3 F (36.8 C) (01/04 1507) Pulse Rate:  [80-89] 80 (01/04 1507) Resp:  [17-18] 18 (01/04 1507) BP: (160-174)/(70-73) 160/73 mmHg (01/04 1507) SpO2:  [93 %-98 %] 98 % (01/04 1507) Physical Exam  Constitutional: He is oriented to person,only. He appears well-developed and well-nourished. No distress.  HENT:  Mouth/Throat: Oropharynx is clear and dry No oropharyngeal exudate.  Cardiovascular: Normal rate, regular rhythm and normal heart sounds. Exam reveals no gallop and no friction rub.  No murmur heard.  Pulmonary/Chest: Effort normal and breath sounds normal. No respiratory distress. Rhonchi heard at bases Abdominal: Soft. Bowel sounds are normal. He exhibits no distension. There is no tenderness.  Lymphadenopathy:  He has no cervical adenopathy.  Neurological: moves all extremities Skin: Skin is warm and dry. No rash noted. No erythema.  Psychiatric: He has a normal mood and affect. His  behavior is normal.     LABS: Results for orders placed or performed during the hospital encounter of 03/30/15 (from the past 48 hour(s))  Basic metabolic panel     Status: Abnormal   Collection Time: 04/04/15  5:16 AM  Result Value Ref Range   Sodium 144 135 - 145 mmol/L   Potassium 4.5 3.5 - 5.1 mmol/L   Chloride 115 (H) 101 - 111 mmol/L   CO2 23 22 - 32 mmol/L   Glucose, Bld 123 (H) 65 - 99 mg/dL   BUN 73 (H) 6 - 20 mg/dL   Creatinine, Ser 2.31 (H) 0.61 - 1.24 mg/dL   Calcium 7.9 (L) 8.9 - 10.3 mg/dL   GFR calc non Af Amer 27 (L) >60 mL/min   GFR calc Af Amer 31 (L) >60 mL/min    Comment: (NOTE) The eGFR has been calculated using the CKD  EPI equation. This calculation has not been validated in all clinical situations. eGFR's persistently <60 mL/min signify possible Chronic Kidney Disease.    Anion gap 6 5 - 15  CBC     Status: Abnormal   Collection Time: 04/04/15  5:16 AM  Result Value Ref Range   WBC 14.7 (H) 4.0 - 10.5 K/uL   RBC 3.91 (L) 4.22 - 5.81 MIL/uL   Hemoglobin 12.3 (L) 13.0 - 17.0 g/dL   HCT 38.6 (L) 39.0 - 52.0 %   MCV 98.7 78.0 - 100.0 fL   MCH 31.5 26.0 - 34.0 pg   MCHC 31.9 30.0 - 36.0 g/dL   RDW 15.9 (H) 11.5 - 15.5 %   Platelets 49 (L) 150 - 400 K/uL    Comment: CONSISTENT WITH PREVIOUS RESULT  CBC     Status: Abnormal   Collection Time: 04/05/15  4:46 AM  Result Value Ref Range   WBC 14.7 (H) 4.0 - 10.5 K/uL   RBC 3.82 (L) 4.22 - 5.81 MIL/uL   Hemoglobin 12.0 (L) 13.0 - 17.0 g/dL   HCT 37.7 (L) 39.0 - 52.0 %   MCV 98.7 78.0 - 100.0 fL   MCH 31.4 26.0 - 34.0 pg   MCHC 31.8 30.0 - 36.0 g/dL   RDW 15.8 (H) 11.5 - 15.5 %   Platelets 64 (L) 150 - 400 K/uL    Comment: CONSISTENT WITH PREVIOUS RESULT  Basic metabolic panel     Status: Abnormal   Collection Time: 04/05/15  4:46 AM  Result Value Ref Range   Sodium 145 135 - 145 mmol/L   Potassium 4.4 3.5 - 5.1 mmol/L   Chloride 112 (H) 101 - 111 mmol/L   CO2 24 22 - 32 mmol/L   Glucose, Bld 90 65 - 99 mg/dL   BUN 60 (H) 6 - 20 mg/dL   Creatinine, Ser 2.05 (H) 0.61 - 1.24 mg/dL   Calcium 8.1 (L) 8.9 - 10.3 mg/dL   GFR calc non Af Amer 31 (L) >60 mL/min   GFR calc Af Amer 36 (L) >60 mL/min    Comment: (NOTE) The eGFR has been calculated using the CKD EPI equation. This calculation has not been validated in all clinical situations. eGFR's persistently <60 mL/min signify possible Chronic Kidney Disease.    Anion gap 9 5 - 15    MICRO: 12/29 blood cx ecoli 12/29 urine cx ecoli IMAGING: Dg Swallowing Func-speech Pathology  04/04/2015  Objective Swallowing Evaluation:   Patient Details Name: [name] MRN: [id number] Date of Birth:  1941-02-17 Today's Date: 04/04/2015 Time: SLP Start Time (  ACUTE ONLY): 1415-SLP Stop Time (ACUTE ONLY): 1445 SLP Time Calculation (min) (ACUTE ONLY): 30 min Past Surgical History Procedure Laterality Date . Cataract extraction w/ intraocular lens  implant, bilateral   HPI: 75 yo male adm to [facility] with AMS, hypoxia, decreased BP - found to be septic and diagnosed with pna.  Pt has h/o MR, dysphagia with ED admit 12/2014 for choking on chicken and required chest compressions at that time, fall July 2016, pressure ulcer and ? Parkinson's (not on meds per MD note).  Swallow eval ordered, pt coughing with liquids but tolerating applesauce welll per order.  RN reports pt aspirating with thinner consistencies.   Subjective: pt asleep in bed but easily arousable Assessment / Plan / Recommendation CHL IP CLINICAL IMPRESSIONS 04/04/2015 Therapy Diagnosis Moderate oral phase dysphagia;Moderate pharyngeal phase dysphagia;Mild cervical esophageal phase dysphagia Clinical Impression Moderate oropharyngeal and mild cervical esophageal dysphagia noted.  Pt with baseline congested cough prior to barium administeration - causing SLP to suspect secretion aspiration.  Oral transiting is ill controlled with pt essentially dumping food/drink into pharynx - oral transiting is very rapid.  Mild tongue base and pharyngeal residuals noted without pt awareness and unfortunately pt did not dry swallow to faciliate clearance.  Pt did aspirate with thin via cup with cough response that was not productive.  Aspiration occured due to spillage into larynx before swallow triggering.  Very poor "mastication" of moist cracker noted with pt essentially swallowing cracker whole. Cracker lodged at UES without pt sensation- pudding aided clearance.  Thicker consistencies compensate for ill oral control but do result in pharyngeal residuals pt does not sense. Pt will be chornic aspiration risk regardless of consistency and SLP suspect pt is aspirating secretions  and therefore option may be to allow pt diet for comfort with precautions.  Use of modified diet may mitigate aspiration but will not be preventative.  SLP to follow up.  Of note, called nurse Anderson Malta and requested respiratory therapy see pt if indicated - as at testing became wheezy after aspiration episode.  Impact on safety and function Severe aspiration risk   CHL IP TREATMENT RECOMMENDATION 04/04/2015 Treatment Recommendations Therapy as outlined in treatment plan below;F/U MBS in --- days (Comment)   Prognosis 04/04/2015 Prognosis for Safe Diet Advancement Guarded Barriers to Reach Goals Severity of deficits;Cognitive deficits Barriers/Prognosis Comment -- CHL IP DIET RECOMMENDATION 04/04/2015 SLP Diet Recommendations NPO- vs po with accepted risks  Liquid Administration via Cup;No straw;Spoon Medication Administration Crushed with puree Compensations Slow rate;Small sips/bites Postural Changes --   CHL IP OTHER RECOMMENDATIONS 04/04/2015 Recommended Consults -- Oral Care Recommendations Oral care BID Other Recommendations Have oral suction available;Order thickener from pharmacy   CHL IP FOLLOW UP RECOMMENDATIONS 04/04/2015 Follow up Recommendations Other (comment)   CHL IP FREQUENCY AND DURATION 04/04/2015 Speech Therapy Frequency (ACUTE ONLY) min 1 x/week Treatment Duration 1 week      CHL IP ORAL PHASE 04/04/2015 Oral Phase Impaired Oral - Pudding Teaspoon -- Oral - Pudding Cup -- Oral - Honey Teaspoon Reduced posterior propulsion;Premature spillage;Decreased bolus cohesion;Weak lingual manipulation Oral - Honey Cup Premature spillage;Decreased bolus cohesion;Reduced posterior propulsion;Weak lingual manipulation Oral - Nectar Teaspoon Premature spillage;Decreased bolus cohesion;Weak lingual manipulation;Reduced posterior propulsion;Lingual pumping Oral - Nectar Cup Premature spillage;Decreased bolus cohesion;Reduced posterior propulsion;Weak lingual manipulation Oral - Nectar Straw -- Oral - Thin Teaspoon Weak lingual  manipulation;Decreased bolus cohesion;Premature spillage;Reduced posterior propulsion Oral - Thin Cup Premature spillage;Decreased bolus cohesion;Weak lingual manipulation;Reduced posterior propulsion Oral - Thin Straw Weak lingual manipulation;Reduced  posterior propulsion;Piecemeal swallowing Oral - Puree Weak lingual manipulation;Premature spillage;Lingual pumping;Decreased bolus cohesion Oral - Mech Soft Premature spillage;Decreased bolus cohesion;Lingual pumping;Impaired mastication;Weak lingual manipulation Oral - Regular -- Oral - Multi-Consistency -- Oral - Pill -- Oral Phase - Comment pt with rapid oral transiting that is poorly controlled, resulting in premature spillage of barium into pharynx  CHL IP PHARYNGEAL PHASE 04/04/2015 Pharyngeal Phase Impaired Pharyngeal- Pudding Teaspoon -- Pharyngeal -- Pharyngeal- Pudding Cup -- Pharyngeal -- Pharyngeal- Honey Teaspoon Reduced tongue base retraction Pharyngeal -- Pharyngeal- Honey Cup Reduced tongue base retraction Pharyngeal -- Pharyngeal- Nectar Teaspoon Reduced tongue base retraction Pharyngeal -- Pharyngeal- Nectar Cup Reduced tongue base retraction Pharyngeal -- Pharyngeal- Nectar Straw -- Pharyngeal -- Pharyngeal- Thin Teaspoon Reduced tongue base retraction;Pharyngeal residue - pyriform;Pharyngeal residue - valleculae Pharyngeal -- Pharyngeal- Thin Cup Reduced tongue base retraction;Pharyngeal residue - valleculae;Pharyngeal residue - pyriform Pharyngeal -- Pharyngeal- Thin Straw Reduced tongue base retraction;Pharyngeal residue - pyriform;Pharyngeal residue - valleculae Pharyngeal -- Pharyngeal- Puree Reduced tongue base retraction;Pharyngeal residue - cp segment;Pharyngeal residue - valleculae;Pharyngeal residue - pyriform Pharyngeal -- Pharyngeal- Mechanical Soft Reduced tongue base retraction;Pharyngeal residue - valleculae;Pharyngeal residue - pyriform Pharyngeal -- Pharyngeal- Regular -- Pharyngeal -- Pharyngeal- Multi-consistency -- Pharyngeal --  Pharyngeal- Pill -- Pharyngeal -- Pharyngeal Comment pt with extended breath hold when swallowing thin liquids - with post=swallow overt coughing, aspiration of thin via cup noted due to premature spillage into pharynx, pt does not sense residuals nor does he dry swallow on command to faciliate clearance  CHL IP CERVICAL ESOPHAGEAL PHASE 04/04/2015 Cervical Esophageal Phase Impaired Pudding Teaspoon -- Pudding Cup -- Honey Teaspoon -- Honey Cup -- Nectar Teaspoon -- Nectar Cup -- Nectar Straw -- Thin Teaspoon -- Thin Cup -- Thin Straw -- Puree -- Mechanical Soft -- Regular -- Multi-consistency -- Pill -- Cervical Esophageal Comment decreased UES opening resulting in residuals at UES/pyriform sinus region intermittently, pt did not dry swallow to clear Luanna Salk, MS [facility] SLP [phone]               Assessment/Plan:  75yo M who is institutionalized, has MR, and has aspiration risk admitted for AMS, hypoxia, hypotension found to have e.coli bacteremia with possible UTI and aspiration pneumonia. He is currently on ceftriaxone, day 7.   E.coli bacteremia c/b possible uti = continue ceftriaxone. Presentation improved with tx and ivf. Will treat for a total of 10-14d. Once ready for discharge, may consider giving oral medication crushed into pudding  Aspiration pna/pneumonitis = currently on abtx. Appear improving based upon repeat cxr per my read.  aki = improved with ivf. Not sure if at his baseline yet.  Thrombocytopenia = hit nadir yesterday. Would avoid heparin or other drugs associated with low platelets

## 2015-04-05 NOTE — Progress Notes (Addendum)
[facility] PROGRESS NOTE  Gary [name] OZH:[id number] DOB: 1940/04/04 DOA: 03/30/2015 PCP: Cheral Bay, MD  Brief Interval history  Gary [name] is an 75 y.o. male with a PMH of mental retardation, from [facility], who was sent to the ED for evaluation of altered mental status, hypoxia and low BP. Upon initial evaluation in the ED, he was found to be febrile, hypotensive, and with AKI. CXR shows pneumonia.    Assessment/Plan: #1 sepsis secondary to Escherichia coli bacteremia, Escherichia coli UTI, aspiration pneumonia versus HCAP Patient currently afebrile. Patient with a leukocytosis. Patient has been evaluated by speech therapy and patient with chronic aspiration risks which are high for modified barium swallow. Patient with poor outcomes with the sofa score of 2-3. Pro calcitonin lactic acid was elevated on admission. Patient blood pressure improved with IV antibiotics and fluid resuscitation. Change IV cefepime to IV Rocephin. Continue supportive care. Continue dysphagia 1 diet with aspiration precautions. Follow.  #2 Escherichia coli bacteremia Likely seeded from the urine as patient also with a Escherichia coli UTI. Patient on IV cefepime which was changed to IV Ancef. Will narrow antibiotics further down to IV Rocephin. Patient with increased aspiration risk. Consult with ID pain antibiotic recommendations and duration. Follow.  #3 Escherichia coli UTI Change IV Ancef to IV Rocephin.  #4 probable aspiration pneumonia versus HCAP Patient has been assessed by speech therapy and patient underwent a modified barium swallow, patient noted to be a severe aspiration risk and likely has been chronically aspirating. Patient currently on a dysphagia 1 diet with aspiration precautions. Change IV Ancef to IV Rocephin.  #5 hyperkalemia Result.  #6 acute renal failure Likely secondary to prerenal azotemia as patient was noted to be hypotensive on admission.  Renal function trending down. Continue hydration. Follow.  #7 probable Parkinson's disease Unclear diagnosis. Nephew was not aware of this diagnosis. Patient not on any medications.  #8 pressure ulcer Present on admission. Skin care per wound care nurse and recommendations.  #9 hyperlipidemia Continue statins/facial.  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 Depakote levels were therapeutic.  #11 thrombocytopenia No evidence of bleeding. Lovenox had been discontinued. Platelet count trending back up. HIT panel pending.  #12 prophylaxis SCDs for DVT prophylaxis.  #13 prognosis Patient with a poor prognosis with a history of mental retardation living in a senior living center presenting with probable aspiration pneumonia versus HCAP noted to be septic source likely Escherichia coli bacteremia and Escherichia coli UTI. Patient also noted to have an acute renal failure. Patient has been assessed by speech therapy and deemed to be severe aspiration risk. Patient on dysphagia 1 diet with aspiration precautions. Patient on empiric IV antibiotics. Palliative care consultation pending for goals of care.   Code Status: Full Family Communication: No family at bedside. Disposition Plan: To NH when acute renal failure resolved, afebrile and pending ID recommendations for antibiotic duration and rxcs. And pending palliative evaluation.   Consultants:  Palliative care pending  ID pending  Procedures:  Modified barium swallow 04/04/2015  Chest x-ray 03/30/2015, 04/01/2015  Antibiotics:  IV cefepime 03/30/2015>>>> 04/02/2015  IV Ancef 04/03/2015>>>> 04/05/2015  IV Rocephin 04/05/2015  IV vancomycin 03/30/2015>>>> 03/31/2015  HPI/Subjective: Patient sleeping.  Objective: Filed Vitals:   04/04/15 2200 04/05/15 0600  BP: 174/71 162/70  Pulse: 89 83  Temp: 98.1 F (36.7 C) 98.7 F (37.1 C)  Resp: 17 18     Intake/Output Summary (Last 24 hours) at 04/05/15 1446  Last data filed at 04/05/15 1400  Gross per 24 hour  Intake 3962.5 ml  Output   2650 ml  Net 1312.5 ml   Filed Weights   03/30/15 1327 03/31/15 0400  Weight: 68.04 kg (150 lb) 62.4 kg (137 lb 9.1 oz)    Exam:   General:  Sleeping  Cardiovascular: RRR  Respiratory: CTAB anterior lung fields.  Abdomen: Soft, nontender, nondistended, positive bowel sounds.  Musculoskeletal: No clubbing cyanosis or edema.  Data Reviewed: Basic Metabolic Panel:  Recent Labs Lab 04/01/15 0335 04/02/15 0342 04/03/15 0600 04/04/15 0516 04/05/15 0446  NA 146* 146* 147* 144 145  K 4.6 4.5 4.5 4.5 4.4  CL 116* 117* 117* 115* 112*  CO2 21* 21* 24 23 24   GLUCOSE 84 93 90 123* 90  BUN 76* 74* 77* 73* 60*  CREATININE 2.44* 2.47* 2.33* 2.31* 2.05*  CALCIUM 7.7* 7.5* 7.9* 7.9* 8.1*   Liver Function Tests:  Recent Labs Lab 03/30/15 1342  AST 35  ALT 24  ALKPHOS 74  BILITOT 0.7  PROT 6.1*  ALBUMIN 2.7*   No results for input(s): LIPASE, AMYLASE in the last 168 hours. No results for input(s): AMMONIA in the last 168 hours. CBC:  Recent Labs Lab 03/30/15 1342 03/31/15 0355 04/01/15 0335 04/02/15 0342 04/04/15 0516 04/05/15 0446  WBC 15.5* 16.5* 11.9* 12.9* 14.7* 14.7*  NEUTROABS 14.4*  --   --   --   --   --   HGB 12.7* 11.3* 11.7* 11.6* 12.3* 12.0*  HCT 37.5* 35.0* 35.0* 36.2* 38.6* 37.7*  MCV 95.4 98.9 95.1 96.3 98.7 98.7  PLT 159 155 144* 106* 49* 64*   Cardiac Enzymes: No results for input(s): CKTOTAL, CKMB, CKMBINDEX, TROPONINI in the last 168 hours. BNP (last 3 results)  Recent Labs  12/07/14 1906  BNP 44.9    ProBNP (last 3 results) No results for input(s): PROBNP in the last 8760 hours.  CBG:  Recent Labs Lab 03/30/15 1316  GLUCAP 104*    Recent Results (from the past 240 hour(s))  Urine culture     Status: None   Collection Time: 03/30/15  1:31 PM  Result Value Ref Range Status    Specimen Description URINE, CATHETERIZED  Final   Special Requests NONE  Final   Culture   Final    >=100,000 COLONIES/mL ESCHERICHIA COLI Performed at [facility]    Report Status 04/01/2015 FINAL  Final   Organism ID, Bacteria ESCHERICHIA COLI  Final      Susceptibility   Escherichia coli - MIC*    AMPICILLIN <=2 SENSITIVE Sensitive     CEFAZOLIN <=4 SENSITIVE Sensitive     CEFTRIAXONE <=1 SENSITIVE Sensitive     CIPROFLOXACIN <=0.25 SENSITIVE Sensitive     GENTAMICIN 2 SENSITIVE Sensitive     IMIPENEM <=0.25 SENSITIVE Sensitive     NITROFURANTOIN <=16 SENSITIVE Sensitive     TRIMETH/SULFA <=20 SENSITIVE Sensitive     AMPICILLIN/SULBACTAM <=2 SENSITIVE Sensitive     PIP/TAZO <=4 SENSITIVE Sensitive     * >=100,000 COLONIES/mL ESCHERICHIA COLI  Culture, blood (routine x 2)     Status: None   Collection Time: 03/30/15  1:40 PM  Result Value Ref Range Status   Specimen Description BLOOD LEFT WRIST  Final   Special Requests BOTTLES DRAWN AEROBIC AND ANAEROBIC 5 CC EA  Final   Culture  Setup Time   Final    GRAM NEGATIVE RODS IN BOTH AEROBIC AND ANAEROBIC BOTTLES CRITICAL RESULT CALLED TO,  READ BACK BY AND VERIFIED WITH: A KOONTZ 1610 03/31/15 MKELLY    Culture   Final    ESCHERICHIA COLI Performed at [facility]    Report Status 04/02/2015 FINAL  Final   Organism ID, Bacteria ESCHERICHIA COLI  Final      Susceptibility   Escherichia coli - MIC*    AMPICILLIN <=2 SENSITIVE Sensitive     CEFAZOLIN <=4 SENSITIVE Sensitive     CEFEPIME <=1 SENSITIVE Sensitive     CEFTAZIDIME <=1 SENSITIVE Sensitive     CEFTRIAXONE <=1 SENSITIVE Sensitive     CIPROFLOXACIN <=0.25 SENSITIVE Sensitive     GENTAMICIN <=1 SENSITIVE Sensitive     IMIPENEM <=0.25 SENSITIVE Sensitive     TRIMETH/SULFA <=20 SENSITIVE Sensitive     AMPICILLIN/SULBACTAM <=2 SENSITIVE Sensitive     PIP/TAZO <=4 SENSITIVE Sensitive     * ESCHERICHIA COLI  Culture, blood (routine x 2)     Status: None    Collection Time: 03/30/15  1:50 PM  Result Value Ref Range Status   Specimen Description BLOOD LEFT FOREARM  Final   Special Requests BOTTLES DRAWN AEROBIC AND ANAEROBIC 5 CC EA  Final   Culture  Setup Time   Final    GRAM NEGATIVE RODS IN BOTH AEROBIC AND ANAEROBIC BOTTLES CRITICAL RESULT CALLED TO, READ BACK BY AND VERIFIED WITH: A. KOONTZ @0729  03/31/15 MKELLY    Culture   Final    ESCHERICHIA COLI SUSCEPTIBILITIES PERFORMED ON PREVIOUS CULTURE WITHIN THE LAST 5 DAYS. Performed at [facility]    Report Status 04/02/2015 FINAL  Final  MRSA PCR Screening     Status: None   Collection Time: 03/30/15  4:40 PM  Result Value Ref Range Status   MRSA by PCR NEGATIVE NEGATIVE Final    Comment:        The GeneXpert MRSA Assay (FDA approved for NASAL specimens only), is one component of a comprehensive MRSA colonization surveillance program. It is not intended to diagnose MRSA infection nor to guide or monitor treatment for MRSA infections.      Studies: Dg Swallowing Func-speech Pathology  04/04/2015  Objective Swallowing Evaluation:   Patient Details Name: Gary [name] MRN: [id number] Date of Birth: Sep 09, 1940 Today's Date: 04/04/2015 Time: SLP Start Time (ACUTE ONLY): 1415-SLP Stop Time (ACUTE ONLY): 1445 SLP Time Calculation (min) (ACUTE ONLY): 30 min Past Surgical History Procedure Laterality Date . Cataract extraction w/ intraocular lens  implant, bilateral   HPI: 75 yo male adm to [facility] with AMS, hypoxia, decreased BP - found to be septic and diagnosed with pna.  Pt has h/o MR, dysphagia with ED admit 12/2014 for choking on chicken and required chest compressions at that time, fall July 2016, pressure ulcer and ? Parkinson's (not on meds per MD note).  Swallow eval ordered, pt coughing with liquids but tolerating applesauce welll per order.  RN reports pt aspirating with thinner consistencies.   Subjective: pt asleep in bed but easily arousable Assessment / Plan /  Recommendation CHL IP CLINICAL IMPRESSIONS 04/04/2015 Therapy Diagnosis Moderate oral phase dysphagia;Moderate pharyngeal phase dysphagia;Mild cervical esophageal phase dysphagia Clinical Impression Moderate oropharyngeal and mild cervical esophageal dysphagia noted.  Pt with baseline congested cough prior to barium administeration - causing SLP to suspect secretion aspiration.  Oral transiting is ill controlled with pt essentially dumping food/drink into pharynx - oral transiting is very rapid.  Mild tongue base and pharyngeal residuals noted without pt awareness and unfortunately pt did not dry swallow  to faciliate clearance.  Pt did aspirate with thin via cup with cough response that was not productive.  Aspiration occured due to spillage into larynx before swallow triggering.  Very poor "mastication" of moist cracker noted with pt essentially swallowing cracker whole. Cracker lodged at UES without pt sensation- pudding aided clearance.  Thicker consistencies compensate for ill oral control but do result in pharyngeal residuals pt does not sense. Pt will be chornic aspiration risk regardless of consistency and SLP suspect pt is aspirating secretions and therefore option may be to allow pt diet for comfort with precautions.  Use of modified diet may mitigate aspiration but will not be preventative.  SLP to follow up.  Of note, called nurse Victorino DikeJennifer and requested respiratory therapy see pt if indicated - as at testing became wheezy after aspiration episode.  Impact on safety and function Severe aspiration risk   CHL IP TREATMENT RECOMMENDATION 04/04/2015 Treatment Recommendations Therapy as outlined in treatment plan below;F/U MBS in --- days (Comment)   Prognosis 04/04/2015 Prognosis for Safe Diet Advancement Guarded Barriers to Reach Goals Severity of deficits;Cognitive deficits Barriers/Prognosis Comment -- CHL IP DIET RECOMMENDATION 04/04/2015 SLP Diet Recommendations NPO- vs po with accepted risks  Liquid  Administration via Cup;No straw;Spoon Medication Administration Crushed with puree Compensations Slow rate;Small sips/bites Postural Changes --   CHL IP OTHER RECOMMENDATIONS 04/04/2015 Recommended Consults -- Oral Care Recommendations Oral care BID Other Recommendations Have oral suction available;Order thickener from pharmacy   CHL IP FOLLOW UP RECOMMENDATIONS 04/04/2015 Follow up Recommendations Other (comment)   CHL IP FREQUENCY AND DURATION 04/04/2015 Speech Therapy Frequency (ACUTE ONLY) min 1 x/week Treatment Duration 1 week      CHL IP ORAL PHASE 04/04/2015 Oral Phase Impaired Oral - Pudding Teaspoon -- Oral - Pudding Cup -- Oral - Honey Teaspoon Reduced posterior propulsion;Premature spillage;Decreased bolus cohesion;Weak lingual manipulation Oral - Honey Cup Premature spillage;Decreased bolus cohesion;Reduced posterior propulsion;Weak lingual manipulation Oral - Nectar Teaspoon Premature spillage;Decreased bolus cohesion;Weak lingual manipulation;Reduced posterior propulsion;Lingual pumping Oral - Nectar Cup Premature spillage;Decreased bolus cohesion;Reduced posterior propulsion;Weak lingual manipulation Oral - Nectar Straw -- Oral - Thin Teaspoon Weak lingual manipulation;Decreased bolus cohesion;Premature spillage;Reduced posterior propulsion Oral - Thin Cup Premature spillage;Decreased bolus cohesion;Weak lingual manipulation;Reduced posterior propulsion Oral - Thin Straw Weak lingual manipulation;Reduced posterior propulsion;Piecemeal swallowing Oral - Puree Weak lingual manipulation;Premature spillage;Lingual pumping;Decreased bolus cohesion Oral - Mech Soft Premature spillage;Decreased bolus cohesion;Lingual pumping;Impaired mastication;Weak lingual manipulation Oral - Regular -- Oral - Multi-Consistency -- Oral - Pill -- Oral Phase - Comment pt with rapid oral transiting that is poorly controlled, resulting in premature spillage of barium into pharynx  CHL IP PHARYNGEAL PHASE 04/04/2015 Pharyngeal Phase  Impaired Pharyngeal- Pudding Teaspoon -- Pharyngeal -- Pharyngeal- Pudding Cup -- Pharyngeal -- Pharyngeal- Honey Teaspoon Reduced tongue base retraction Pharyngeal -- Pharyngeal- Honey Cup Reduced tongue base retraction Pharyngeal -- Pharyngeal- Nectar Teaspoon Reduced tongue base retraction Pharyngeal -- Pharyngeal- Nectar Cup Reduced tongue base retraction Pharyngeal -- Pharyngeal- Nectar Straw -- Pharyngeal -- Pharyngeal- Thin Teaspoon Reduced tongue base retraction;Pharyngeal residue - pyriform;Pharyngeal residue - valleculae Pharyngeal -- Pharyngeal- Thin Cup Reduced tongue base retraction;Pharyngeal residue - valleculae;Pharyngeal residue - pyriform Pharyngeal -- Pharyngeal- Thin Straw Reduced tongue base retraction;Pharyngeal residue - pyriform;Pharyngeal residue - valleculae Pharyngeal -- Pharyngeal- Puree Reduced tongue base retraction;Pharyngeal residue - cp segment;Pharyngeal residue - valleculae;Pharyngeal residue - pyriform Pharyngeal -- Pharyngeal- Mechanical Soft Reduced tongue base retraction;Pharyngeal residue - valleculae;Pharyngeal residue - pyriform Pharyngeal -- Pharyngeal- Regular -- Pharyngeal -- Pharyngeal- Multi-consistency -- Pharyngeal --  Pharyngeal- Pill -- Pharyngeal -- Pharyngeal Comment pt with extended breath hold when swallowing thin liquids - with post=swallow overt coughing, aspiration of thin via cup noted due to premature spillage into pharynx, pt does not sense residuals nor does he dry swallow on command to faciliate clearance  CHL IP CERVICAL ESOPHAGEAL PHASE 04/04/2015 Cervical Esophageal Phase Impaired Pudding Teaspoon -- Pudding Cup -- Honey Teaspoon -- Honey Cup -- Nectar Teaspoon -- Nectar Cup -- Nectar Straw -- Thin Teaspoon -- Thin Cup -- Thin Straw -- Puree -- Mechanical Soft -- Regular -- Multi-consistency -- Pill -- Cervical Esophageal Comment decreased UES opening resulting in residuals at UES/pyriform sinus region intermittently, pt did not dry swallow to clear  Donavan Burnet, MS [facility] SLP [phone]               Scheduled Meds: . antiseptic oral rinse  7 mL Mouth Rinse q12n4p  . atorvastatin  20 mg Oral QHS  . benztropine  0.5 mg Oral BID  . cefTRIAXone (ROCEPHIN)  IV  2 g Intravenous Q24H  . chlorhexidine  15 mL Mouth/Throat BID  . divalproex  500 mg Oral BID  . escitalopram  10 mg Oral Daily  . feeding supplement (ENSURE ENLIVE)  237 mL Oral TID BM  . omega-3 acid ethyl esters  2 g Oral BID  . pneumococcal 23 valent vaccine  0.5 mL Intramuscular Tomorrow-1000  . QUEtiapine  50 mg Oral BID   Continuous Infusions: . dextrose 150 mL/hr at 04/05/15 0226    Principal Problem:   Sepsis (HCC) Active Problems:   E coli bacteremia   Hospital-acquired pneumonia   E. coli UTI   Mental retardation   Hyperlipidemia   Parkinson's disease (HCC)   AKI (acute kidney injury) (HCC)   Pressure ulcer   Hypernatremia   Thrombocytopenia (HCC)    Time spent: 35 minutes.    [facility] MD [facility] Pager [phone]. If 7PM-7AM, please contact night-coverage at [email or web address], password [facility] 04/05/2015, 2:46 PM  LOS: 6 days

## 2015-04-06 ENCOUNTER — Inpatient Hospital Stay (HOSPITAL_COMMUNITY): Payer: Medicare Other

## 2015-04-06 DIAGNOSIS — I10 Essential (primary) hypertension: Secondary | ICD-10-CM | POA: Insufficient documentation

## 2015-04-06 DIAGNOSIS — R509 Fever, unspecified: Secondary | ICD-10-CM

## 2015-04-06 DIAGNOSIS — Z960 Presence of urogenital implants: Secondary | ICD-10-CM

## 2015-04-06 LAB — CBC WITH DIFFERENTIAL/PLATELET
BASOS ABS: 0.1 10*3/uL (ref 0.0–0.1)
Basophils Relative: 1 %
EOS ABS: 0.1 10*3/uL (ref 0.0–0.7)
Eosinophils Relative: 1 %
HCT: 34.6 % — ABNORMAL LOW (ref 39.0–52.0)
HEMOGLOBIN: 11.2 g/dL — AB (ref 13.0–17.0)
LYMPHS ABS: 1 10*3/uL (ref 0.7–4.0)
Lymphocytes Relative: 7 %
MCH: 31.7 pg (ref 26.0–34.0)
MCHC: 32.4 g/dL (ref 30.0–36.0)
MCV: 98 fL (ref 78.0–100.0)
MONOS PCT: 11 %
Monocytes Absolute: 1.5 10*3/uL — ABNORMAL HIGH (ref 0.1–1.0)
NEUTROS ABS: 11.1 10*3/uL — AB (ref 1.7–7.7)
Neutrophils Relative %: 80 %
PLATELETS: 87 10*3/uL — AB (ref 150–400)
RBC: 3.53 MIL/uL — ABNORMAL LOW (ref 4.22–5.81)
RDW: 15.7 % — AB (ref 11.5–15.5)
WBC: 13.8 10*3/uL — ABNORMAL HIGH (ref 4.0–10.5)

## 2015-04-06 LAB — BASIC METABOLIC PANEL
ANION GAP: 7 (ref 5–15)
BUN: 55 mg/dL — ABNORMAL HIGH (ref 6–20)
CALCIUM: 8.3 mg/dL — AB (ref 8.9–10.3)
CO2: 26 mmol/L (ref 22–32)
Chloride: 116 mmol/L — ABNORMAL HIGH (ref 101–111)
Creatinine, Ser: 1.77 mg/dL — ABNORMAL HIGH (ref 0.61–1.24)
GFR, EST AFRICAN AMERICAN: 42 mL/min — AB (ref >60)
GFR, EST NON AFRICAN AMERICAN: 37 mL/min — AB (ref >60)
GLUCOSE: 94 mg/dL (ref 65–99)
POTASSIUM: 4.4 mmol/L (ref 3.5–5.1)
Sodium: 149 mmol/L — ABNORMAL HIGH (ref 135–145)

## 2015-04-06 MED ORDER — ACETAMINOPHEN 325 MG PO TABS: 650.0000 mg | ORAL_TABLET | Freq: Once | ORAL | Status: DC

## 2015-04-06 NOTE — Progress Notes (Signed)
PHARMACY NOTE -  Pharmacy to adjust antibiotics for renal function  Patient currently on day #7 effective antibiotic therapy, now currently on ceftriaxone 2gm IV q24h for E. Coli bacteremia.   12/29 >> cefepime >> 1/1 1/1 >> Ancef >> 1/4 1/4 >>ceftriaxone >>  Will sign off at this time as ceftriaxone dose not require renal dose adjustment and is currently prescribed correct dose for indication.  Please reconsult if a change in clinical status warrants re-evaluation of dosage.  Juliette Alcideustin Zeigler, PharmD, BCPS.   Pager: [phone] 04/06/2015 11:46 AM

## 2015-04-06 NOTE — Progress Notes (Signed)
PT Cancellation Note  Patient Details Name: Gary [name] MRN: [id number] DOB: 01/21/1941   Cancelled Treatment:    Reason Eval/Treat Not Completed: Medical issues which prohibited therapy. RN recommended PT be held today. Will check back if/when appropriate.  Rebeca AlertJannie Jahira Swiss, MPT Pager: [phone]

## 2015-04-06 NOTE — Progress Notes (Signed)
I was able to reach Mr. Elonda Huskyldridge's nephew, Mr. Freddi StarrDowdy at [phone], this afternoon. Unfortunately he is on his way out to pick up his grandchild. We agreed to speak tomorrow morning 04/07/15 ~10-828 am to discuss GOC. I did tell him that I am very concerned with the severity of Mr. Zito's swallowing and fear this may become more of a problem. Mr. Freddi StarrDowdy is very motivated take good care of Mr. Dayna Barkerldridge and look forward to our conversation tomorrow.   Yong ChannelAlicia Laura Radilla, NP Palliative Medicine Team Pager # [phone] (M-F 8a-5p) Team Phone # [phone] (Nights/Weekends)

## 2015-04-06 NOTE — Plan of Care (Addendum)
Pt continues to have fever. Reviewed ID note. Will change abx to Zosyn. Pt having hypoxia in upper 80s. Progressed to venti mask. No increased WOB. Extra dose Tylenol. Will monitor after VM applied. KJKG, NP Later, RN entered room to find pt very lethargic. RRRN called. Pt has no increased WOB. Per RRRN, Lisabeth RegisterHans, pt responds to sternal rub. ABG ordered and looks fine. VSS. ? Dose of Seroquel. May need to decrease tomorrow. Will follow. KJKG, NP

## 2015-04-06 NOTE — Progress Notes (Signed)
Pt's temp 102.8 axillary. Tylenol given.Dr Janee Mornhompson sent message yo notify.

## 2015-04-06 NOTE — Progress Notes (Signed)
Palliative:  I have assessed and visited with Mr. Gary [name]. I have left message with Mr. Dowdy, nephew, requesting a return call so we can discuss GOC. Thank you for this consult.   Yong ChannelAlicia Choya Tornow, NP Palliative Medicine Team Pager # [phone] (M-F 8a-5p) Team Phone # [phone] (Nights/Weekends)

## 2015-04-06 NOTE — Progress Notes (Signed)
RT placed patient on 55% venti mask per NP's order.

## 2015-04-06 NOTE — Consult Note (Signed)
Consultation Note Date: 04/06/2015   Patient Name: Gary [name]  DOB: [birth year]  MRN: [id number]  Age / Sex: 75 y.o., male  PCP: Cheral Bay, MD Referring Physician: Rodolph Bong, MD  Reason for Consultation: Establishing goals of care    Clinical Assessment/Narrative: Gary [name] is more alert today and says he has no pain or discomfort or complaints. I did speak with Gary [name] - his nephew - regarding GOC. He explains that Gary [name] has declined slowly over the past 1-2 yrs but especially over the past few months. Lived the last 2 years in ALF after the caretaker at his boarding house he lived a long time. He has had some more mobility issues using cane and walker over last year. Last couple months has had more behavioral issues with agitation and memory issues. Gary [name] says that he he has noticed the decline.   We discussed my concern over his aspiration and poor prognosis. Gary [name] is very understanding. After discussing and he agrees that QOL is key and poor for Gary [name]. He fears that QOL is poor especially if Gary [name] has to change facilities again and try and adjust to a new place and new people this alone is very difficult on him. Gary [name] agrees to DNR. He would like to take the weekend to see if Gary [name] has any improvement or continues to decline and aspirate. We did discuss the option for comfort and hospice with continued decline and Mr. Dowdy is open to these options. We will talk again on Monday. Please call [phone] with any palliative issues over the weekend.   Contacts/Participants in Discussion: Primary Decision Maker: Gary [name]   Relationship to Patient nephew   SUMMARY OF RECOMMENDATIONS - DNR - Continue current treatments - See how he does over the weekend and we will readdress options depending on outcomes (SNF vs hospice) - Open to recommendations for Gary [name]'s best QOL including hospice and more  comfort  Code Status/Advance Care Planning: DNR  Symptom Management:   Currently no symptoms.   Palliative Prophylaxis:   Bowel Regimen, Delirium Protocol, Frequent Pain Assessment and Oral Care   Psycho-social/Spiritual:  Support System: Adequate Desire for further Chaplaincy support:no Additional Recommendations: Caregiving  Support/Resources and Education on Hospice  Prognosis: I fear he will continue to aspirate and could have weeks prognosis with transition to comfort.   Discharge Planning: To be determined.    Chief Complaint/ Primary Diagnoses: Present on Admission:  . Hospital-acquired pneumonia . Sepsis (HCC) . Hyperlipidemia . Parkinson's disease (HCC) . AKI (acute kidney injury) (HCC) . Pressure ulcer . E coli bacteremia . Hypernatremia . Thrombocytopenia (HCC) . E. coli UTI  I have reviewed the medical record, interviewed the patient and family, and examined the patient. The following aspects are pertinent.  Past Medical History  Diagnosis Date  . Mental retardation   . Hypertension   . Osteoarthritis   . Hyperlipidemia   . Agitation    Social History   Social History  . Marital Status: Single    Spouse Name: N/A  . Number of Children: N/A  . Years of Education: N/A   Social History Main Topics  . Smoking status: Never Smoker   . Smokeless tobacco: None  . Alcohol Use: No  . Drug Use: None  . Sexual Activity: Not Asked   Other Topics Concern  . None   Social History Narrative   No family history on file. Scheduled Meds: . antiseptic oral rinse  7 mL Mouth  Rinse q12n4p  . atorvastatin  20 mg Oral QHS  . benztropine  0.5 mg Oral BID  . cefTRIAXone (ROCEPHIN)  IV  2 g Intravenous Q24H  . chlorhexidine  15 mL Mouth/Throat BID  . divalproex  500 mg Oral BID  . escitalopram  10 mg Oral Daily  . feeding supplement (ENSURE ENLIVE)  237 mL Oral TID BM  . omega-3 acid ethyl esters  2 g Oral BID  . pneumococcal 23 valent vaccine  0.5 mL  Intramuscular Tomorrow-1000  . QUEtiapine  50 mg Oral BID   Continuous Infusions: . dextrose 150 mL/hr at 04/05/15 0226   PRN Meds:.acetaminophen, albuterol, guaiFENesin-dextromethorphan, hydrOXYzine, LORazepam Medications Prior to Admission:  Prior to Admission medications   Medication Sig Start Date End Date Taking? Authorizing Provider  acetaminophen (TYLENOL) 500 MG tablet Take 1,000 mg by mouth every 12 (twelve) hours as needed (for pain).   Yes Historical Provider, MD  atorvastatin (LIPITOR) 20 MG tablet Take 20 mg by mouth at bedtime.   Yes Historical Provider, MD  benztropine (COGENTIN) 0.5 MG tablet Take 0.5 mg by mouth 2 (two) times daily.   Yes Historical Provider, MD  chlorhexidine (PERIDEX) 0.12 % solution Use as directed 15 mLs in the mouth or throat 2 (two) times daily.   Yes Historical Provider, MD  divalproex (DEPAKOTE) 500 MG DR tablet Take 500 mg by mouth 2 (two) times daily.   Yes Historical Provider, MD  ENSURE (ENSURE) Take 237 mLs by mouth 3 (three) times daily between meals.   Yes Historical Provider, MD  escitalopram (LEXAPRO) 10 MG tablet Take 10 mg by mouth daily.   Yes Historical Provider, MD  etodolac (LODINE) 400 MG tablet Take 400 mg by mouth 2 (two) times daily.    Yes Historical Provider, MD  fish oil-omega-3 fatty acids 1000 MG capsule Take 2 g by mouth 2 (two) times daily.   Yes Historical Provider, MD  hydrOXYzine (ATARAX/VISTARIL) 25 MG tablet Take 25 mg by mouth every 4 (four) hours as needed for itching.   Yes Historical Provider, MD  LORazepam (ATIVAN) 0.5 MG tablet Take 0.5 mg by mouth 2 (two) times daily.   Yes Historical Provider, MD  LORazepam (ATIVAN) 0.5 MG tablet Take 0.5 mg by mouth every 12 (twelve) hours as needed (for agitation).   Yes Historical Provider, MD  QUEtiapine (SEROQUEL) 50 MG tablet Take 50 mg by mouth 2 (two) times daily.   Yes Historical Provider, MD  triamcinolone (KENALOG) 0.025 % cream Apply 1 application topically every 6  (six) hours as needed (for itching).    Yes Historical Provider, MD  ibuprofen (ADVIL,MOTRIN) 800 MG tablet Take 1 tablet (800 mg total) by mouth every 8 (eight) hours as needed. Patient not taking: Reported on 12/17/2014 10/16/14   Charlestine Night, PA-C  perphenazine (TRILAFON) 4 MG tablet Take 4 mg by mouth daily after breakfast. Reported on 03/30/2015    Historical Provider, MD   No Known Allergies  Review of Systems  Unable to perform ROS   Physical Exam  Constitutional: He appears well-developed.  HENT:  Head: Normocephalic and atraumatic.  Cardiovascular: Normal rate.   Respiratory: No accessory muscle usage. No tachypnea. No respiratory distress. He has rhonchi. He has rales.  GI: Soft. Normal appearance.  Neurological: He is disoriented.    Vital Signs: BP 131/61 mmHg  Pulse 76  Temp(Src) 99.9 F (37.7 C) (Oral)  Resp 16  Ht 5\' 5"  (1.651 m)  Wt 62.4 kg (137 lb 9.1  oz)  BMI 22.89 kg/m2  SpO2 93%  SpO2: SpO2: 93 % O2 Device:SpO2: 93 % O2 Flow Rate: .O2 Flow Rate (L/min): 2 L/min  IO: Intake/output summary:  Intake/Output Summary (Last 24 hours) at 04/06/15 0855 Last data filed at 04/06/15 [id number]  Gross per 24 hour  Intake 1372.5 ml  Output   3350 ml  Net -1977.5 ml    LBM: Last BM Date: 04/04/15 Baseline Weight: Weight: 68.04 kg (150 lb) Most recent weight: Weight: 62.4 kg (137 lb 9.1 oz)      Palliative Assessment/Data:  Flowsheet Rows        Most Recent Value   Intake Tab    Referral Department  Hospitalist   Unit at Time of Referral  Med/Surg Unit   Palliative Care Primary Diagnosis  Sepsis/Infectious Disease   Date Notified  04/04/15   Palliative Care Type  New Palliative care   Reason for referral  Clarify Goals of Care   Date of Admission  03/30/15   # of days IP prior to Palliative referral  5   Clinical Assessment    Psychosocial & Spiritual Assessment    Palliative Care Outcomes       Additional Data Reviewed:  CBC:    Component  Value Date/Time   WBC 13.8* 04/06/2015 0500   HGB 11.2* 04/06/2015 0500   HCT 34.6* 04/06/2015 0500   PLT 87* 04/06/2015 0500   MCV 98.0 04/06/2015 0500   NEUTROABS 11.1* 04/06/2015 0500   LYMPHSABS 1.0 04/06/2015 0500   MONOABS 1.5* 04/06/2015 0500   EOSABS 0.1 04/06/2015 0500   BASOSABS 0.1 04/06/2015 0500   Comprehensive Metabolic Panel:    Component Value Date/Time   NA 149* 04/06/2015 0500   K 4.4 04/06/2015 0500   CL 116* 04/06/2015 0500   CO2 26 04/06/2015 0500   BUN 55* 04/06/2015 0500   CREATININE 1.77* 04/06/2015 0500   GLUCOSE 94 04/06/2015 0500   CALCIUM 8.3* 04/06/2015 0500   AST 35 03/30/2015 1342   ALT 24 03/30/2015 1342   ALKPHOS 74 03/30/2015 1342   BILITOT 0.7 03/30/2015 1342   PROT 6.1* 03/30/2015 1342   ALBUMIN 2.7* 03/30/2015 1342     Time In: 0800 Time Out: 0920 Time Total: 80min Greater than 50%  of this time was spent counseling and coordinating care related to the above assessment and plan.  Signed by: Ulice BoldParker, Ieesha Abbasi C, NP  Ulice BoldAlicia C Tamim Skog, NP  04/06/2015, 8:55 AM  Please contact Palliative Medicine Team phone at [phone] for questions and concerns.

## 2015-04-06 NOTE — Progress Notes (Signed)
    [facility]    Date of Admission:  03/30/2015   Total days of antibiotics 8        Day 2 ceftriaxone           ID: Gary [name] is a 75 y.o. male with e.coli bacteremia and hcap Principal Problem:   Sepsis (HCC) Active Problems:   Hospital-acquired pneumonia   Mental retardation   Hyperlipidemia   Parkinson's disease (HCC)   AKI (acute kidney injury) (HCC)   Pressure ulcer   E coli bacteremia   Hypernatremia   Thrombocytopenia (HCC)   E. coli UTI   Acute renal failure (HCC)    Subjective: High fever of up to 102.3 today, tmax yesterday 100.7.   Medications:  . antiseptic oral rinse  7 mL Mouth Rinse q12n4p  . atorvastatin  20 mg Oral QHS  . benztropine  0.5 mg Oral BID  . cefTRIAXone (ROCEPHIN)  IV  2 g Intravenous Q24H  . chlorhexidine  15 mL Mouth/Throat BID  . divalproex  500 mg Oral BID  . escitalopram  10 mg Oral Daily  . feeding supplement (ENSURE ENLIVE)  237 mL Oral TID BM  . omega-3 acid ethyl esters  2 g Oral BID  . pneumococcal 23 valent vaccine  0.5 mL Intramuscular Tomorrow-1000  . QUEtiapine  50 mg Oral BID    Objective: Vital signs in last 24 hours: Temp:  [99.9 F (37.7 C)-102.8 F (39.3 C)] 102.8 F (39.3 C) (01/05 1300) Pulse Rate:  [76-96] 82 (01/05 1300) Resp:  [16-25] 25 (01/05 1300) BP: (131-159)/(61-83) 148/72 mmHg (01/05 1300) SpO2:  [90 %-93 %] 92 % (01/05 1300)  Physical Exam  Constitutional: He is sleeping with tongue protruded. No distress.  HENT:  Mouth/Throat: Oropharynx is clear and moist. No oropharyngeal exudate.  Cardiovascular: Normal rate, regular rhythm and normal heart sounds. Exam reveals no gallop and no friction rub.  No murmur heard.  Pulmonary/Chest: Effort normal and breath sounds normal. No respiratory distress. Bilateral course rhonchi Abdominal: Soft. Bowel sounds are normal. He exhibits no distension. There is no tenderness.  Lymphadenopathy:  He has no cervical  adenopathy.      Lab Results  Recent Labs  04/05/15 0446 04/06/15 0500  WBC 14.7* 13.8*  HGB 12.0* 11.2*  HCT 37.7* 34.6*  NA 145 149*  K 4.4 4.4  CL 112* 116*  CO2 24 26  BUN 60* 55*  CREATININE 2.05* 1.77*    Microbiology: 12/29 blood cx ecoli Studies/Results: Repeat cxr from 1/5 per my read, still has unchanged patchy bilateral infiltrate similar to 12/31  Assessment/Plan: ecoli bacteremia = will continue with ceftriaxone  Pneumonia = currently covered with ceftriaxone. If patient continues to have fevers, recommend to change to amp/sub to also cover aspiration  Fevers = will repeat blood cx for now. Will check cbc with diff in the morning. Follow fever trend to see if need to change his abtx regimen. Consider discontinuation of foley catheter  Shardae Kleinman, [facility] Cell: [phone] Pager: [phone]  04/06/2015, 5:28 PM

## 2015-04-06 NOTE — Progress Notes (Signed)
Nutrition Follow-up  INTERVENTION:   Continue Ensure Enlive po BID, each supplement provides 350 kcal and 20 grams of protein RD to continue to monitor  NUTRITION DIAGNOSIS:   Inadequate oral intake related to poor appetite as evidenced by per patient/family report.  Ongoing.  GOAL:   Patient will meet greater than or equal to 90% of their needs  Progressing  MONITOR:   PO intake, Labs, I & O's, Skin , GOC  ASSESSMENT:   Gary [name] is an 75 y.o. male with a PMH of mental retardation, [facility], who was sent to the ED for evaluation of altered mental status, hypoxia and low BP. The patient is unable to provide any additional information, but denies shortness of breath or pain. No family is with him, and no notes were sent with him regarding the reason he was sent here. Attempts to reach his primary contact were not successful. According to an RN at his facility, he was in his usual state of health until this morning, when he appeared to be and unable to feed himself, he subsequently had a coughing spell while eating resulting in respiratory distress which prompted her to call EMS  Pt is eating 50-100% of meals. Pt placed on dysphagia I diet with honey thick liquids. Pt not drinking supplements consistently. GOC decision pending.  Labs reviewed: Elevated Na, BUN & Creatinine  Diet Order:  DIET - DYS 1 Room service appropriate?: No; Fluid consistency:: Honey Thick  Skin:  Wound (see comment) (Stg I PU to Sacrum)  Last BM:  1/3  Height:   Ht Readings from Last 1 Encounters:  03/30/15 5\' 5"  (1.651 m)    Weight:   Wt Readings from Last 1 Encounters:  03/31/15 137 lb 9.1 oz (62.4 kg)    Ideal Body Weight:  61.81 kg  BMI:  Body mass index is 22.89 kg/(m^2).  Estimated Nutritional Needs:   Kcal:  1500-1700  Protein:  75-85g  Fluid:  1.7L/day  EDUCATION NEEDS:   No education needs identified at this time  Gary FrancoLindsey Tasheka Houseman, MS, RD,  LDN Pager: [phone] After Hours Pager: [phone]

## 2015-04-06 NOTE — Progress Notes (Signed)
[facility] PROGRESS NOTE  Gary [name] ZOX:[id number] DOB: Oct 14, 1940 DOA: 03/30/2015 PCP: Cheral Bay, MD  Brief Interval history  Gary [name] is an 75 y.o. male with a PMH of mental retardation, from [facility], who was sent to the ED for evaluation of altered mental status, hypoxia and low BP. Upon initial evaluation in the ED, he was found to be febrile, hypotensive, and with AKI. CXR shows pneumonia. Patient also noted to have a Escherichia coli bacteremia and Escherichia coli UTI. IV antibiotics have been narrowed to IV Rocephin. Patient still spiking fevers. ID has been consulted and is following.    Assessment/Plan: #1 sepsis secondary to Escherichia coli bacteremia, Escherichia coli UTI, aspiration pneumonia versus HCAP Patient currently febrile with temperature of 102.8 today. Patient with a leukocytosis. Patient has been evaluated by speech therapy and patient with chronic aspiration risks which are high for modified barium swallow. Patient with poor outcomes with the sofa score of 2-3. Pro calcitonin lactic acid was elevated on admission. Patient blood pressure improved with IV antibiotics and fluid resuscitation. Continue IV Rocephin. We'll repeat blood cultures 2. Repeat chest x-ray. Continue supportive care. Continue dysphagia 1 diet with aspiration precautions. Follow.  #2 Escherichia coli bacteremia Likely seeded from the urine as patient also with a Escherichia coli UTI. Patient on IV cefepime which was changed to IV Ancef. IV Ancef has been changed to IV Rocephin. Patient still spiking fevers. Patient with increased aspiration risk. ID following and have recommended continuing IV antibiotics while in-house and transitioning to oral medication that can be crushed. Patient currently still spiking fevers. Repeat blood cultures. ID following. Follow.  #3 Escherichia coli UTI Continue IV Rocephin.  #4 probable aspiration pneumonia versus  HCAP Patient has been assessed by speech therapy and patient underwent a modified barium swallow, patient noted to be a severe aspiration risk and likely has been chronically aspirating. Patient currently on a dysphagia 1 diet with aspiration precautions. Continue IV Rocephin.  #5 hyperkalemia/hypernatremia Hyperkalemia has resolved. Patient now hypernatremic with a sodium of 149. D5W. BMET in the morning.  #6 acute renal failure Likely secondary to prerenal azotemia as patient was noted to be hypotensive on admission. Renal function trending down. Continue hydration. Follow.  #7 probable Parkinson's disease Unclear diagnosis. Nephew was not aware of this diagnosis. Patient not on any medications.  #8 pressure ulcer Present on admission. Skin care per wound care nurse and recommendations.  #9 hyperlipidemia Continue statins. Hold fish oil.  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 Depakote levels were therapeutic. Hold morning dose of Seroquel as patient sleeping.  #11 thrombocytopenia No evidence of bleeding. Lovenox had been discontinued. Platelet count trending back up. HIT panel pending.  #12 prophylaxis SCDs for DVT prophylaxis.  #13 prognosis Patient with a poor prognosis with a history of mental retardation living in a senior living center presenting with probable aspiration pneumonia versus HCAP noted to be septic source likely Escherichia coli bacteremia and Escherichia coli UTI. Patient also noted to have an acute renal failure. Patient has been assessed by speech therapy and deemed to be severe aspiration risk. Patient on dysphagia 1 diet with aspiration precautions. Patient on empiric IV antibiotics. Palliative care consultation pending for goals of care.   Code Status: Full Family Communication: No family at bedside. Disposition Plan: To NH when acute renal failure resolved, afebrile and pending ID  recommendations for antibiotic duration and rxcs. and pending palliative evaluation.   Consultants:  Palliative care pending  ID : Dr Drue SecondSnider 04/05/2015  Procedures:  Modified barium swallow 04/04/2015  Chest x-ray 03/30/2015, 04/01/2015  Antibiotics:  IV cefepime 03/30/2015>>>> 04/02/2015  IV Ancef 04/03/2015>>>> 04/05/2015  IV Rocephin 04/05/2015  IV vancomycin 03/30/2015>>>> 03/31/2015  HPI/Subjective: Patient sleeping. Patient with fevers this afternoon.  Objective: Filed Vitals:   04/05/15 2200 04/06/15 0600  BP: 159/83 131/61  Pulse: 96 76  Temp: 100.7 F (38.2 C) 99.9 F (37.7 C)  Resp: 19 16    Intake/Output Summary (Last 24 hours) at 04/06/15 1209 Last data filed at 04/06/15 [id number]  Gross per 24 hour  Intake 1372.5 ml  Output   3550 ml  Net -2177.5 ml   Filed Weights   03/30/15 1327 03/31/15 0400  Weight: 68.04 kg (150 lb) 62.4 kg (137 lb 9.1 oz)    Exam:   General:  Sleeping  Cardiovascular: RRR  Respiratory: CTAB anterior lung fields.  Abdomen: Soft, nontender, nondistended, positive bowel sounds.  Musculoskeletal: No clubbing cyanosis or edema.  Data Reviewed: Basic Metabolic Panel:  Recent Labs Lab 04/02/15 0342 04/03/15 0600 04/04/15 0516 04/05/15 0446 04/06/15 0500  NA 146* 147* 144 145 149*  K 4.5 4.5 4.5 4.4 4.4  CL 117* 117* 115* 112* 116*  CO2 21* 24 23 24 26   GLUCOSE 93 90 123* 90 94  BUN 74* 77* 73* 60* 55*  CREATININE 2.47* 2.33* 2.31* 2.05* 1.77*  CALCIUM 7.5* 7.9* 7.9* 8.1* 8.3*   Liver Function Tests:  Recent Labs Lab 03/30/15 1342  AST 35  ALT 24  ALKPHOS 74  BILITOT 0.7  PROT 6.1*  ALBUMIN 2.7*   No results for input(s): LIPASE, AMYLASE in the last 168 hours. No results for input(s): AMMONIA in the last 168 hours. CBC:  Recent Labs Lab 03/30/15 1342  04/01/15 0335 04/02/15 0342 04/04/15 0516 04/05/15 0446 04/06/15 0500  WBC 15.5*  < > 11.9* 12.9* 14.7* 14.7* 13.8*  NEUTROABS 14.4*  --   --    --   --   --  11.1*  HGB 12.7*  < > 11.7* 11.6* 12.3* 12.0* 11.2*  HCT 37.5*  < > 35.0* 36.2* 38.6* 37.7* 34.6*  MCV 95.4  < > 95.1 96.3 98.7 98.7 98.0  PLT 159  < > 144* 106* 49* 64* 87*  < > = values in this interval not displayed. Cardiac Enzymes: No results for input(s): CKTOTAL, CKMB, CKMBINDEX, TROPONINI in the last 168 hours. BNP (last 3 results)  Recent Labs  12/07/14 1906  BNP 44.9    ProBNP (last 3 results) No results for input(s): PROBNP in the last 8760 hours.  CBG:  Recent Labs Lab 03/30/15 1316  GLUCAP 104*    Recent Results (from the past 240 hour(s))  Urine culture     Status: None   Collection Time: 03/30/15  1:31 PM  Result Value Ref Range Status   Specimen Description URINE, CATHETERIZED  Final   Special Requests NONE  Final   Culture   Final    >=100,000 COLONIES/mL ESCHERICHIA COLI [facility]    Report Status 04/01/2015 FINAL  Final   Organism ID, Bacteria ESCHERICHIA COLI  Final      Susceptibility   Escherichia coli - MIC*    AMPICILLIN <=2 SENSITIVE Sensitive     CEFAZOLIN <=4 SENSITIVE Sensitive     CEFTRIAXONE <=1 SENSITIVE Sensitive     CIPROFLOXACIN <=0.25 SENSITIVE Sensitive     GENTAMICIN 2 SENSITIVE Sensitive  IMIPENEM <=0.25 SENSITIVE Sensitive     NITROFURANTOIN <=16 SENSITIVE Sensitive     TRIMETH/SULFA <=20 SENSITIVE Sensitive     AMPICILLIN/SULBACTAM <=2 SENSITIVE Sensitive     PIP/TAZO <=4 SENSITIVE Sensitive     * >=100,000 COLONIES/mL ESCHERICHIA COLI  Culture, blood (routine x 2)     Status: None   Collection Time: 03/30/15  1:40 PM  Result Value Ref Range Status   Specimen Description BLOOD LEFT WRIST  Final   Special Requests BOTTLES DRAWN AEROBIC AND ANAEROBIC 5 CC EA  Final   Culture  Setup Time   Final    GRAM NEGATIVE RODS IN BOTH AEROBIC AND ANAEROBIC BOTTLES CRITICAL RESULT CALLED TO, READ BACK BY AND VERIFIED WITH: A KOONTZ 0727 03/31/15 MKELLY    Culture   Final    ESCHERICHIA  COLI Performed at [facility]    Report Status 04/02/2015 FINAL  Final   Organism ID, Bacteria ESCHERICHIA COLI  Final      Susceptibility   Escherichia coli - MIC*    AMPICILLIN <=2 SENSITIVE Sensitive     CEFAZOLIN <=4 SENSITIVE Sensitive     CEFEPIME <=1 SENSITIVE Sensitive     CEFTAZIDIME <=1 SENSITIVE Sensitive     CEFTRIAXONE <=1 SENSITIVE Sensitive     CIPROFLOXACIN <=0.25 SENSITIVE Sensitive     GENTAMICIN <=1 SENSITIVE Sensitive     IMIPENEM <=0.25 SENSITIVE Sensitive     TRIMETH/SULFA <=20 SENSITIVE Sensitive     AMPICILLIN/SULBACTAM <=2 SENSITIVE Sensitive     PIP/TAZO <=4 SENSITIVE Sensitive     * ESCHERICHIA COLI  Culture, blood (routine x 2)     Status: None   Collection Time: 03/30/15  1:50 PM  Result Value Ref Range Status   Specimen Description BLOOD LEFT FOREARM  Final   Special Requests BOTTLES DRAWN AEROBIC AND ANAEROBIC 5 CC EA  Final   Culture  Setup Time   Final    GRAM NEGATIVE RODS IN BOTH AEROBIC AND ANAEROBIC BOTTLES CRITICAL RESULT CALLED TO, READ BACK BY AND VERIFIED WITH: A. KOONTZ @0729  03/31/15 MKELLY    Culture   Final    ESCHERICHIA COLI SUSCEPTIBILITIES PERFORMED ON PREVIOUS CULTURE WITHIN THE LAST 5 DAYS. Performed at [facility]    Report Status 04/02/2015 FINAL  Final  MRSA PCR Screening     Status: None   Collection Time: 03/30/15  4:40 PM  Result Value Ref Range Status   MRSA by PCR NEGATIVE NEGATIVE Final    Comment:        The GeneXpert MRSA Assay (FDA approved for NASAL specimens only), is one component of a comprehensive MRSA colonization surveillance program. It is not intended to diagnose MRSA infection nor to guide or monitor treatment for MRSA infections.      Studies: Dg Swallowing Func-speech Pathology  04/04/2015  Objective Swallowing Evaluation:   Patient Details Name: Gary [name] MRN: [id number] Date of Birth: 1940/12/23 Today's Date: 04/04/2015 Time: SLP Start Time (ACUTE ONLY): 1415-SLP Stop  Time (ACUTE ONLY): 1445 SLP Time Calculation (min) (ACUTE ONLY): 30 min Past Surgical History Procedure Laterality Date . Cataract extraction w/ intraocular lens  implant, bilateral   HPI: 75 yo male adm to [facility] with AMS, hypoxia, decreased BP - found to be septic and diagnosed with pna.  Pt has h/o MR, dysphagia with ED admit 12/2014 for choking on chicken and required chest compressions at that time, fall July 2016, pressure ulcer and ? Parkinson's (not on meds per MD note).  Swallow eval ordered, pt coughing with liquids but tolerating applesauce welll per order.  RN reports pt aspirating with thinner consistencies.   Subjective: pt asleep in bed but easily arousable Assessment / Plan / Recommendation CHL IP CLINICAL IMPRESSIONS 04/04/2015 Therapy Diagnosis Moderate oral phase dysphagia;Moderate pharyngeal phase dysphagia;Mild cervical esophageal phase dysphagia Clinical Impression Moderate oropharyngeal and mild cervical esophageal dysphagia noted.  Pt with baseline congested cough prior to barium administeration - causing SLP to suspect secretion aspiration.  Oral transiting is ill controlled with pt essentially dumping food/drink into pharynx - oral transiting is very rapid.  Mild tongue base and pharyngeal residuals noted without pt awareness and unfortunately pt did not dry swallow to faciliate clearance.  Pt did aspirate with thin via cup with cough response that was not productive.  Aspiration occured due to spillage into larynx before swallow triggering.  Very poor "mastication" of moist cracker noted with pt essentially swallowing cracker whole. Cracker lodged at UES without pt sensation- pudding aided clearance.  Thicker consistencies compensate for ill oral control but do result in pharyngeal residuals pt does not sense. Pt will be chornic aspiration risk regardless of consistency and SLP suspect pt is aspirating secretions and therefore option may be to allow pt diet for comfort with precautions.  Use of  modified diet may mitigate aspiration but will not be preventative.  SLP to follow up.  Of note, called nurse Victorino Dike and requested respiratory therapy see pt if indicated - as at testing became wheezy after aspiration episode.  Impact on safety and function Severe aspiration risk   CHL IP TREATMENT RECOMMENDATION 04/04/2015 Treatment Recommendations Therapy as outlined in treatment plan below;F/U MBS in --- days (Comment)   Prognosis 04/04/2015 Prognosis for Safe Diet Advancement Guarded Barriers to Reach Goals Severity of deficits;Cognitive deficits Barriers/Prognosis Comment -- CHL IP DIET RECOMMENDATION 04/04/2015 SLP Diet Recommendations NPO- vs po with accepted risks  Liquid Administration via Cup;No straw;Spoon Medication Administration Crushed with puree Compensations Slow rate;Small sips/bites Postural Changes --   CHL IP OTHER RECOMMENDATIONS 04/04/2015 Recommended Consults -- Oral Care Recommendations Oral care BID Other Recommendations Have oral suction available;Order thickener from pharmacy   CHL IP FOLLOW UP RECOMMENDATIONS 04/04/2015 Follow up Recommendations Other (comment)   CHL IP FREQUENCY AND DURATION 04/04/2015 Speech Therapy Frequency (ACUTE ONLY) min 1 x/week Treatment Duration 1 week      CHL IP ORAL PHASE 04/04/2015 Oral Phase Impaired Oral - Pudding Teaspoon -- Oral - Pudding Cup -- Oral - Honey Teaspoon Reduced posterior propulsion;Premature spillage;Decreased bolus cohesion;Weak lingual manipulation Oral - Honey Cup Premature spillage;Decreased bolus cohesion;Reduced posterior propulsion;Weak lingual manipulation Oral - Nectar Teaspoon Premature spillage;Decreased bolus cohesion;Weak lingual manipulation;Reduced posterior propulsion;Lingual pumping Oral - Nectar Cup Premature spillage;Decreased bolus cohesion;Reduced posterior propulsion;Weak lingual manipulation Oral - Nectar Straw -- Oral - Thin Teaspoon Weak lingual manipulation;Decreased bolus cohesion;Premature spillage;Reduced posterior  propulsion Oral - Thin Cup Premature spillage;Decreased bolus cohesion;Weak lingual manipulation;Reduced posterior propulsion Oral - Thin Straw Weak lingual manipulation;Reduced posterior propulsion;Piecemeal swallowing Oral - Puree Weak lingual manipulation;Premature spillage;Lingual pumping;Decreased bolus cohesion Oral - Mech Soft Premature spillage;Decreased bolus cohesion;Lingual pumping;Impaired mastication;Weak lingual manipulation Oral - Regular -- Oral - Multi-Consistency -- Oral - Pill -- Oral Phase - Comment pt with rapid oral transiting that is poorly controlled, resulting in premature spillage of barium into pharynx  CHL IP PHARYNGEAL PHASE 04/04/2015 Pharyngeal Phase Impaired Pharyngeal- Pudding Teaspoon -- Pharyngeal -- Pharyngeal- Pudding Cup -- Pharyngeal -- Pharyngeal- Honey Teaspoon Reduced tongue base retraction Pharyngeal -- Pharyngeal- Honey  Cup Reduced tongue base retraction Pharyngeal -- Pharyngeal- Nectar Teaspoon Reduced tongue base retraction Pharyngeal -- Pharyngeal- Nectar Cup Reduced tongue base retraction Pharyngeal -- Pharyngeal- Nectar Straw -- Pharyngeal -- Pharyngeal- Thin Teaspoon Reduced tongue base retraction;Pharyngeal residue - pyriform;Pharyngeal residue - valleculae Pharyngeal -- Pharyngeal- Thin Cup Reduced tongue base retraction;Pharyngeal residue - valleculae;Pharyngeal residue - pyriform Pharyngeal -- Pharyngeal- Thin Straw Reduced tongue base retraction;Pharyngeal residue - pyriform;Pharyngeal residue - valleculae Pharyngeal -- Pharyngeal- Puree Reduced tongue base retraction;Pharyngeal residue - cp segment;Pharyngeal residue - valleculae;Pharyngeal residue - pyriform Pharyngeal -- Pharyngeal- Mechanical Soft Reduced tongue base retraction;Pharyngeal residue - valleculae;Pharyngeal residue - pyriform Pharyngeal -- Pharyngeal- Regular -- Pharyngeal -- Pharyngeal- Multi-consistency -- Pharyngeal -- Pharyngeal- Pill -- Pharyngeal -- Pharyngeal Comment pt with extended breath  hold when swallowing thin liquids - with post=swallow overt coughing, aspiration of thin via cup noted due to premature spillage into pharynx, pt does not sense residuals nor does he dry swallow on command to faciliate clearance  CHL IP CERVICAL ESOPHAGEAL PHASE 04/04/2015 Cervical Esophageal Phase Impaired Pudding Teaspoon -- Pudding Cup -- Honey Teaspoon -- Honey Cup -- Nectar Teaspoon -- Nectar Cup -- Nectar Straw -- Thin Teaspoon -- Thin Cup -- Thin Straw -- Puree -- Mechanical Soft -- Regular -- Multi-consistency -- Pill -- Cervical Esophageal Comment decreased UES opening resulting in residuals at UES/pyriform sinus region intermittently, pt did not dry swallow to clear Donavan Burnet, MS [facility] SLP [phone]               Scheduled Meds: . antiseptic oral rinse  7 mL Mouth Rinse q12n4p  . atorvastatin  20 mg Oral QHS  . benztropine  0.5 mg Oral BID  . cefTRIAXone (ROCEPHIN)  IV  2 g Intravenous Q24H  . chlorhexidine  15 mL Mouth/Throat BID  . divalproex  500 mg Oral BID  . escitalopram  10 mg Oral Daily  . feeding supplement (ENSURE ENLIVE)  237 mL Oral TID BM  . omega-3 acid ethyl esters  2 g Oral BID  . pneumococcal 23 valent vaccine  0.5 mL Intramuscular Tomorrow-1000  . QUEtiapine  50 mg Oral BID   Continuous Infusions: . dextrose 150 mL/hr at 04/05/15 0226    Principal Problem:   Sepsis (HCC) Active Problems:   E coli bacteremia   Hospital-acquired pneumonia   E. coli UTI   Mental retardation   Hyperlipidemia   Parkinson's disease (HCC)   AKI (acute kidney injury) (HCC)   Pressure ulcer   Hypernatremia   Thrombocytopenia (HCC)   Acute renal failure (HCC)    Time spent: 35 minutes.    [facility] MD [facility] Pager [phone]. If 7PM-7AM, please contact night-coverage at [email or web address], password [facility] 04/06/2015, 12:09 PM  LOS: 7 days

## 2015-04-07 DIAGNOSIS — Z515 Encounter for palliative care: Secondary | ICD-10-CM | POA: Insufficient documentation

## 2015-04-07 DIAGNOSIS — R1310 Dysphagia, unspecified: Secondary | ICD-10-CM | POA: Insufficient documentation

## 2015-04-07 LAB — BLOOD GAS, ARTERIAL
Acid-Base Excess: 4.1 mmol/L — ABNORMAL HIGH (ref 0.0–2.0)
BICARBONATE: 27.9 meq/L — AB (ref 20.0–24.0)
Drawn by: 405301
FIO2: 0.55
O2 Saturation: 97.9 %
PH ART: 7.429 (ref 7.350–7.450)
PO2 ART: 113 mmHg — AB (ref 80.0–100.0)
Patient temperature: 101.8
TCO2: 25.5 mmol/L (ref 0–100)
pCO2 arterial: 43.8 mmHg (ref 35.0–45.0)

## 2015-04-07 LAB — CBC
HCT: 32.9 % — ABNORMAL LOW (ref 39.0–52.0)
Hemoglobin: 10.4 g/dL — ABNORMAL LOW (ref 13.0–17.0)
MCH: 31.5 pg (ref 26.0–34.0)
MCHC: 31.6 g/dL (ref 30.0–36.0)
MCV: 99.7 fL (ref 78.0–100.0)
PLATELETS: 124 10*3/uL — AB (ref 150–400)
RBC: 3.3 MIL/uL — ABNORMAL LOW (ref 4.22–5.81)
RDW: 15.5 % (ref 11.5–15.5)
WBC: 10.4 10*3/uL (ref 4.0–10.5)

## 2015-04-07 LAB — BASIC METABOLIC PANEL
ANION GAP: 7 (ref 5–15)
BUN: 45 mg/dL — AB (ref 6–20)
CALCIUM: 8.1 mg/dL — AB (ref 8.9–10.3)
CO2: 31 mmol/L (ref 22–32)
Chloride: 113 mmol/L — ABNORMAL HIGH (ref 101–111)
Creatinine, Ser: 1.75 mg/dL — ABNORMAL HIGH (ref 0.61–1.24)
GFR calc Af Amer: 43 mL/min — ABNORMAL LOW (ref >60)
GFR, EST NON AFRICAN AMERICAN: 37 mL/min — AB (ref >60)
GLUCOSE: 106 mg/dL — AB (ref 65–99)
Potassium: 4.3 mmol/L (ref 3.5–5.1)
SODIUM: 151 mmol/L — AB (ref 135–145)

## 2015-04-07 LAB — GLUCOSE, CAPILLARY: Glucose-Capillary: 105 mg/dL — ABNORMAL HIGH (ref 65–99)

## 2015-04-07 MED ORDER — PIPERACILLIN-TAZOBACTAM 3.375 G IVPB
3.3750 g | Freq: Three times a day (TID) | INTRAVENOUS | Status: DC
  Administered 2015-04-07 – 2015-04-09 (×8): 3.375 g via INTRAVENOUS
  Filled 2015-04-07 (×10): qty 50

## 2015-04-07 MED ORDER — ACETAMINOPHEN 650 MG RE SUPP
650.0000 mg | Freq: Once | RECTAL | Status: AC
  Administered 2015-04-07: 650 mg via RECTAL
  Filled 2015-04-07: qty 1

## 2015-04-07 NOTE — Progress Notes (Signed)
Entered pt's s room to give him Tylenol, and found that he was not responsive. I could not wake him up. Called rapid response, obtained ABG, did sternal rub and pt  moved both his arms up. Rapid response nurse opinion was that he was heavily sedated due to his fevers, and his 2200 meds of seroquil, depakote. Will continue to monitor.

## 2015-04-07 NOTE — Progress Notes (Signed)
ANTIBIOTIC CONSULT NOTE - INITIAL  Pharmacy Consult for zosyn Indication: HCAP  No Known Allergies  Patient Measurements: Height: 5\' 5"  (165.1 cm) Weight: 137 lb 9.1 oz (62.4 kg) IBW/kg (Calculated) : 61.5 Adjusted Body Weight:   Vital Signs: Temp: 98.4 F (36.9 C) (01/06 0224) Temp Source: Axillary (01/06 0224) BP: 128/62 mmHg (01/05 2356) Pulse Rate: 78 (01/05 2356) Intake/Output from previous day: 01/05 0701 - 01/06 0700 In: 3951.7 [I.V.:3951.7] Out: 2325 [Urine:2325] Intake/Output from this shift: Total I/O In: -  Out: 925 [Urine:925]  Labs:  Recent Labs  04/04/15 0516 04/05/15 0446 04/06/15 0500  WBC 14.7* 14.7* 13.8*  HGB 12.3* 12.0* 11.2*  PLT 49* 64* 87*  CREATININE 2.31* 2.05* 1.77*   Estimated Creatinine Clearance: 32.8 mL/min (by C-G formula based on Cr of 1.77). No results for input(s): VANCOTROUGH, VANCOPEAK, VANCORANDOM, GENTTROUGH, GENTPEAK, GENTRANDOM, TOBRATROUGH, TOBRAPEAK, TOBRARND, AMIKACINPEAK, AMIKACINTROU, AMIKACIN in the last 72 hours.   Microbiology: Recent Results (from the past 720 hour(s))  Urine culture     Status: None   Collection Time: 03/30/15  1:31 PM  Result Value Ref Range Status   Specimen Description URINE, CATHETERIZED  Final   Special Requests NONE  Final   Culture   Final    >=100,000 COLONIES/mL ESCHERICHIA COLI Performed at [facility]    Report Status 04/01/2015 FINAL  Final   Organism ID, Bacteria ESCHERICHIA COLI  Final      Susceptibility   Escherichia coli - MIC*    AMPICILLIN <=2 SENSITIVE Sensitive     CEFAZOLIN <=4 SENSITIVE Sensitive     CEFTRIAXONE <=1 SENSITIVE Sensitive     CIPROFLOXACIN <=0.25 SENSITIVE Sensitive     GENTAMICIN 2 SENSITIVE Sensitive     IMIPENEM <=0.25 SENSITIVE Sensitive     NITROFURANTOIN <=16 SENSITIVE Sensitive     TRIMETH/SULFA <=20 SENSITIVE Sensitive     AMPICILLIN/SULBACTAM <=2 SENSITIVE Sensitive     PIP/TAZO <=4 SENSITIVE Sensitive     * >=100,000 COLONIES/mL  ESCHERICHIA COLI  Culture, blood (routine x 2)     Status: None   Collection Time: 03/30/15  1:40 PM  Result Value Ref Range Status   Specimen Description BLOOD LEFT WRIST  Final   Special Requests BOTTLES DRAWN AEROBIC AND ANAEROBIC 5 CC EA  Final   Culture  Setup Time   Final    GRAM NEGATIVE RODS IN BOTH AEROBIC AND ANAEROBIC BOTTLES CRITICAL RESULT CALLED TO, READ BACK BY AND VERIFIED WITH: A KOONTZ 0727 03/31/15 MKELLY    Culture   Final    ESCHERICHIA COLI Performed at [facility]    Report Status 04/02/2015 FINAL  Final   Organism ID, Bacteria ESCHERICHIA COLI  Final      Susceptibility   Escherichia coli - MIC*    AMPICILLIN <=2 SENSITIVE Sensitive     CEFAZOLIN <=4 SENSITIVE Sensitive     CEFEPIME <=1 SENSITIVE Sensitive     CEFTAZIDIME <=1 SENSITIVE Sensitive     CEFTRIAXONE <=1 SENSITIVE Sensitive     CIPROFLOXACIN <=0.25 SENSITIVE Sensitive     GENTAMICIN <=1 SENSITIVE Sensitive     IMIPENEM <=0.25 SENSITIVE Sensitive     TRIMETH/SULFA <=20 SENSITIVE Sensitive     AMPICILLIN/SULBACTAM <=2 SENSITIVE Sensitive     PIP/TAZO <=4 SENSITIVE Sensitive     * ESCHERICHIA COLI  Culture, blood (routine x 2)     Status: None   Collection Time: 03/30/15  1:50 PM  Result Value Ref Range Status   Specimen Description  BLOOD LEFT FOREARM  Final   Special Requests BOTTLES DRAWN AEROBIC AND ANAEROBIC 5 CC EA  Final   Culture  Setup Time   Final    GRAM NEGATIVE RODS IN BOTH AEROBIC AND ANAEROBIC BOTTLES CRITICAL RESULT CALLED TO, READ BACK BY AND VERIFIED WITH: A. KOONTZ @0729  03/31/15 MKELLY    Culture   Final    ESCHERICHIA COLI SUSCEPTIBILITIES PERFORMED ON PREVIOUS CULTURE WITHIN THE LAST 5 DAYS. [facility]    Report Status 04/02/2015 FINAL  Final  MRSA PCR Screening     Status: None   Collection Time: 03/30/15  4:40 PM  Result Value Ref Range Status   MRSA by PCR NEGATIVE NEGATIVE Final    Comment:        The GeneXpert MRSA Assay  (FDA approved for NASAL specimens only), is one component of a comprehensive MRSA colonization surveillance program. It is not intended to diagnose MRSA infection nor to guide or monitor treatment for MRSA infections.     Medical History: Past Medical History  Diagnosis Date  . Mental retardation   . Hypertension   . Osteoarthritis   . Hyperlipidemia   . Agitation     Medications:  Anti-infectives    Start     Dose/Rate Route Frequency Ordered Stop   04/07/15 0015  piperacillin-tazobactam (ZOSYN) IVPB 3.375 g     3.375 g 12.5 mL/hr over 240 Minutes Intravenous 3 times per day 04/07/15 0004     04/05/15 1000  cefTRIAXone (ROCEPHIN) 2 g in dextrose 5 % 50 mL IVPB  Status:  Discontinued     2 g 100 mL/hr over 30 Minutes Intravenous Every 24 hours 04/05/15 0831 04/06/15 2341   04/03/15 1000  ceFAZolin (ANCEF) IVPB 1 g/50 mL premix  Status:  Discontinued     1 g 100 mL/hr over 30 Minutes Intravenous Every 12 hours 04/02/15 1531 04/05/15 0831   03/31/15 1400  vancomycin (VANCOCIN) IVPB 1000 mg/200 mL premix  Status:  Discontinued     1,000 mg 200 mL/hr over 60 Minutes Intravenous Every 48 hours 03/30/15 1608 03/31/15 0918   03/31/15 1000  ceFEPIme (MAXIPIME) 1 g in dextrose 5 % 50 mL IVPB  Status:  Discontinued     1 g 100 mL/hr over 30 Minutes Intravenous Every 24 hours 03/30/15 1608 04/02/15 1531   03/30/15 1530  vancomycin (VANCOCIN) IVPB 1000 mg/200 mL premix     1,000 mg 200 mL/hr over 60 Minutes Intravenous STAT 03/30/15 1443 03/30/15 1626   03/30/15 1500  ceFEPIme (MAXIPIME) 1 g in dextrose 5 % 50 mL IVPB     1 g 100 mL/hr over 30 Minutes Intravenous  Once 03/30/15 1430 03/30/15 1526     Assessment: Patient being followed by pharmacy and now MD wishes for pharmacy to dose zosyn for HCAP due to continued fever.  Goal of Therapy:  Zosyn based on renal function  Appropriate antibiotic dosing for renal function; eradication of infection   Plan:  Follow up culture  results  Zosyn 3.375g IV Q8H infused over 4hrs.   Darlina GuysGrimsley Jr, Jacquenette ShoneJulian Crowford 04/07/2015,2:38 AM

## 2015-04-07 NOTE — Clinical Social Work Placement (Signed)
CSW spoke with patient's nephew, Baldo AshCarl (ph#: [phone]) re: SNF bed offers. Nephew is leaning towards [facility] but after talking with PMT NP, Helmut Musterlicia - would like to wait and see how he does over the weekend. Paula ComptonKarla at [facility] made aware.   CSW will follow-up Monday.    Lincoln MaxinKelly Rahmel Nedved, LCSW [facility] Clinical Social Worker cell #: [phone]   CLINICAL SOCIAL WORK PLACEMENT  NOTE  Date:  04/07/2015  Patient Details  Name: Laure KidneyRoy Clair Breach MRN: [id number] Date of Birth: 02/07/1941  Clinical Social Work is seeking post-discharge placement for this patient at the Skilled  Nursing Facility level of care (*CSW will initial, date and re-position this form in  chart as items are completed):  Yes   Patient/family provided with [facility] Clinical Social Work Department's list of facilities offering this level of care within the geographic area requested by the patient (or if unable, by the patient's family).  Yes   Patient/family informed of their freedom to choose among providers that offer the needed level of care, that participate in Medicare, Medicaid or managed care program needed by the patient, have an available bed and are willing to accept the patient.  Yes   Patient/family informed of [place]'s ownership interest in [facility] and [facility], as well as of the fact that they are under no obligation to receive care at these facilities.  PASRR submitted to EDS on 04/07/15     PASRR number received on 04/07/15     Existing PASRR number confirmed on       FL2 transmitted to all facilities in geographic area requested by pt/family on 04/07/15     FL2 transmitted to all facilities within larger geographic area on       Patient informed that his/her managed care company has contracts with or will negotiate with certain facilities, including the following:        Yes   Patient/family informed of bed offers received.  Patient chooses bed at  [facility]     Physician recommends and patient chooses bed at      Patient to be transferred to   on  .  Patient to be transferred to facility by       Patient family notified on   of transfer.  Name of family member notified:        PHYSICIAN       Additional Comment:    _______________________________________________ Arlyss RepressHarrison, Delawrence Fridman F, LCSW 04/07/2015, 10:12 AM

## 2015-04-07 NOTE — Progress Notes (Signed)
[facility] PROGRESS NOTE  Gary [name] GYJ:[id number] DOB: 06/19/1940 DOA: 03/30/2015 PCP: Maylon Peppers, MD  Brief Interval history Gary [name] is an 75 y.o. male with a PMH of mental retardation, from [facility] [address], who was sent to the ED for evaluation of altered mental status, hypoxia and low BP. Upon initial evaluation in the ED, he was found to be febrile, hypotensive, and with AKI. CXR shows pneumonia. Patient also noted to have a Escherichia coli bacteremia and Escherichia coli UTI. IV antibiotics have been narrowed to IV Rocephin. Patient still spiking fevers. ID has been consulted and is following. Palliative care on board as well.    Assessment/Plan: #1 sepsis secondary to Escherichia coli bacteremia, Escherichia coli UTI, aspiration pneumonia versus HCAP Patient intermittently spiking fevers. Leukocytosis resolved. Patient has been evaluated by speech therapy and patient with chronic aspiration risks which are high for modified barium swallow. Patient with poor outcomes with the sofa score of 2-3. Pro calcitonin lactic acid was elevated on admission. Patient has been on several different IV antibiotics since admission as outlined below. Switched to IV Zosyn 04/05/14 due to ongoing temperature spikes. Repeat blood cultures 21/5: Negative to date. Chest x-ray 1/5: Patchy bilateral lung infiltrates. Continue supportive care. Continue dysphagia 1 diet with aspiration precautions. Follow.  Discussed with palliative care team on 1/6: They have met with patient's nephew today and indicate that patient wishes to continue management over the weekend and regroup on Monday. However if patient declines over the weekend, he is apparently open to transitioning to comfort oriented care.   #2 Escherichia coli bacteremia Likely seeded from the urine as patient also with a Escherichia coli UTI. Patient has been on several appropriate IV Abx since admission. Patient still  spiking fevers. Patient with increased aspiration risk. ID following.  As above  #3 Escherichia coli UTI As above  #4 probable aspiration pneumonia versus HCAP Patient has been assessed by speech therapy and patient underwent a modified barium swallow, patient noted to be a severe aspiration risk and likely has been chronically aspirating. Patient currently on a dysphagia 1 diet with aspiration precautions. Abx Mx as above Hypoxia overnight likely related to this and possible ongoing aspiration  #5 hyperkalemia/hypernatremia Hyperkalemia has resolved. Patient now hypernatremic with a sodium of 149>151. D5W. BMET in the morning.  #6 acute renal failure Likely secondary to prerenal azotemia as patient was noted to be hypotensive on admission. Renal function trending down. Continue hydration. Follow.  #7 probable Parkinson's disease Unclear diagnosis. Nephew was not aware of this diagnosis. Patient not on any medications.  #8 pressure ulcer Present on admission. Skin care per wound care nurse and recommendations.  #9 hyperlipidemia Continue statins. Hold fish oil.  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 Depakote levels were therapeutic. Hold morning dose of Seroquel as patient sleeping.  #11 thrombocytopenia No evidence of bleeding. Lovenox had been discontinued. Platelet count trending back up. HIT panel pending.  #12 prophylaxis SCDs for DVT prophylaxis.  #13 prognosis Patient with a poor prognosis with a history of mental retardation living in a senior living center presenting with probable aspiration pneumonia versus HCAP noted to be septic source likely Escherichia coli bacteremia and Escherichia coli UTI. Patient also noted to have an acute renal failure. Patient has been assessed by speech therapy and deemed to be severe aspiration risk. Patient on dysphagia 1 diet with aspiration precautions. Patient on  empiric IV antibiotics. Palliative care consultation pending for  goals of care - as above, Changed to DNR today.   Code Status: DNR Family Communication: No family at bedside. Disposition Plan: To be determined.   Consultants:  Palliative care  ID : Dr Baxter Flattery 04/05/2015  Procedures:  Modified barium swallow 04/04/2015  Chest x-ray 03/30/2015, 04/01/2015  Antibiotics:  IV cefepime 03/30/2015>>>> 04/02/2015  IV Ancef 04/03/2015>>>> 04/05/2015  IV Rocephin 04/05/2015> DC';ed  IV vancomycin 03/30/2015>>>> 03/31/2015  IV Zosyn 1/5>  HPI/Subjective: Patient non verbal- looks around. Overnight events noted  Objective:  Filed Vitals:   04/06/15 2356 04/07/15 0224 04/07/15 0504 04/07/15 1347  BP: 128/62  119/58 124/60  Pulse: 78  58 57  Temp: 101.8 F (38.8 C) 98.4 F (36.9 C) 97.8 F (36.6 C) 98.7 F (37.1 C)  TempSrc: Axillary Axillary Axillary Axillary  Resp: '18  13 16  ' Height:      Weight:      SpO2: 92%  100% 100%     Intake/Output Summary (Last 24 hours) at 04/07/15 1820 Last data filed at 04/07/15 1400  Gross per 24 hour  Intake   1870 ml  Output   2025 ml  Net   -155 ml   Filed Weights   03/30/15 1327 03/31/15 0400  Weight: 68.04 kg (150 lb) 62.4 kg (137 lb 9.1 oz)    Exam:   General:  Pleasant elderly male sitting comfortably propped up in bed without distress. Awake and alert. Makes some sound but does not verbalize. Keeps sticking tongue out intermittently.   Cardiovascular: RRR. No JVD or pedal edema.   Respiratory: reduced breath sounds bilaterally. No increased work of breathing.  Abdomen: Soft, nontender, nondistended, positive bowel sounds.  Musculoskeletal: No clubbing cyanosis or edema.  CNS: alert. Not oriented and does not follow commands.  Data Reviewed: Basic Metabolic Panel:  Recent Labs Lab 04/03/15 0600 04/04/15 0516 04/05/15 0446 04/06/15 0500 04/07/15 0427  NA 147* 144 145 149* 151*  K 4.5 4.5 4.4 4.4 4.3  CL  117* 115* 112* 116* 113*  CO2 '24 23 24 26 31  ' GLUCOSE 90 123* 90 94 106*  BUN 77* 73* 60* 55* 45*  CREATININE 2.33* 2.31* 2.05* 1.77* 1.75*  CALCIUM 7.9* 7.9* 8.1* 8.3* 8.1*   Liver Function Tests: No results for input(s): AST, ALT, ALKPHOS, BILITOT, PROT, ALBUMIN in the last 168 hours. No results for input(s): LIPASE, AMYLASE in the last 168 hours. No results for input(s): AMMONIA in the last 168 hours. CBC:  Recent Labs Lab 04/02/15 0342 04/04/15 0516 04/05/15 0446 04/06/15 0500 04/07/15 0427  WBC 12.9* 14.7* 14.7* 13.8* 10.4  NEUTROABS  --   --   --  11.1*  --   HGB 11.6* 12.3* 12.0* 11.2* 10.4*  HCT 36.2* 38.6* 37.7* 34.6* 32.9*  MCV 96.3 98.7 98.7 98.0 99.7  PLT 106* 49* 64* 87* 124*   Cardiac Enzymes: No results for input(s): CKTOTAL, CKMB, CKMBINDEX, TROPONINI in the last 168 hours. BNP (last 3 results)  Recent Labs  12/07/14 1906  BNP 44.9    ProBNP (last 3 results) No results for input(s): PROBNP in the last 8760 hours.  CBG:  Recent Labs Lab 04/07/15 0004  GLUCAP 105*    Recent Results (from the past 240 hour(s))  Urine culture     Status: None   Collection Time: 03/30/15  1:31 PM  Result Value Ref Range Status   Specimen Description URINE, CATHETERIZED  Final   Special Requests NONE  Final   Culture   Final    >=  100,000 COLONIES/mL ESCHERICHIA COLI Performed at [facility]    Report Status 04/01/2015 FINAL  Final   Organism ID, Bacteria ESCHERICHIA COLI  Final      Susceptibility   Escherichia coli - MIC*    AMPICILLIN <=2 SENSITIVE Sensitive     CEFAZOLIN <=4 SENSITIVE Sensitive     CEFTRIAXONE <=1 SENSITIVE Sensitive     CIPROFLOXACIN <=0.25 SENSITIVE Sensitive     GENTAMICIN 2 SENSITIVE Sensitive     IMIPENEM <=0.25 SENSITIVE Sensitive     NITROFURANTOIN <=16 SENSITIVE Sensitive     TRIMETH/SULFA <=20 SENSITIVE Sensitive     AMPICILLIN/SULBACTAM <=2 SENSITIVE Sensitive     PIP/TAZO <=4 SENSITIVE Sensitive     * >=100,000  COLONIES/mL ESCHERICHIA COLI  Culture, blood (routine x 2)     Status: None   Collection Time: 03/30/15  1:40 PM  Result Value Ref Range Status   Specimen Description BLOOD LEFT WRIST  Final   Special Requests BOTTLES DRAWN AEROBIC AND ANAEROBIC 5 CC EA  Final   Culture  Setup Time   Final    GRAM NEGATIVE RODS IN BOTH AEROBIC AND ANAEROBIC BOTTLES CRITICAL RESULT CALLED TO, READ BACK BY AND VERIFIED WITH: A KOONTZ [id number] 03/31/15 MKELLY    Culture   Final    ESCHERICHIA COLI Performed at [facility]    Report Status 04/02/2015 FINAL  Final   Organism ID, Bacteria ESCHERICHIA COLI  Final      Susceptibility   Escherichia coli - MIC*    AMPICILLIN <=2 SENSITIVE Sensitive     CEFAZOLIN <=4 SENSITIVE Sensitive     CEFEPIME <=1 SENSITIVE Sensitive     CEFTAZIDIME <=1 SENSITIVE Sensitive     CEFTRIAXONE <=1 SENSITIVE Sensitive     CIPROFLOXACIN <=0.25 SENSITIVE Sensitive     GENTAMICIN <=1 SENSITIVE Sensitive     IMIPENEM <=0.25 SENSITIVE Sensitive     TRIMETH/SULFA <=20 SENSITIVE Sensitive     AMPICILLIN/SULBACTAM <=2 SENSITIVE Sensitive     PIP/TAZO <=4 SENSITIVE Sensitive     * ESCHERICHIA COLI  Culture, blood (routine x 2)     Status: None   Collection Time: 03/30/15  1:50 PM  Result Value Ref Range Status   Specimen Description BLOOD LEFT FOREARM  Final   Special Requests BOTTLES DRAWN AEROBIC AND ANAEROBIC 5 CC EA  Final   Culture  Setup Time   Final    GRAM NEGATIVE RODS IN BOTH AEROBIC AND ANAEROBIC BOTTLES CRITICAL RESULT CALLED TO, READ BACK BY AND VERIFIED WITH: A. KOONTZ '@[id number]'  03/31/15 MKELLY    Culture   Final    ESCHERICHIA COLI SUSCEPTIBILITIES PERFORMED ON PREVIOUS CULTURE WITHIN THE LAST 5 DAYS. Performed at [facility]    Report Status 04/02/2015 FINAL  Final  MRSA PCR Screening     Status: None   Collection Time: 03/30/15  4:40 PM  Result Value Ref Range Status   MRSA by PCR NEGATIVE NEGATIVE Final    Comment:        The GeneXpert MRSA  Assay (FDA approved for NASAL specimens only), is one component of a comprehensive MRSA colonization surveillance program. It is not intended to diagnose MRSA infection nor to guide or monitor treatment for MRSA infections.   Culture, blood (routine x 2)     Status: None (Preliminary result)   Collection Time: 04/06/15  6:35 PM  Result Value Ref Range Status   Specimen Description BLOOD RIGHT HAND  Final   Special Requests BOTTLES DRAWN  AEROBIC AND ANAEROBIC 10 CC  Final   Culture   Final    NO GROWTH < 24 HOURS Performed at [facility]    Report Status PENDING  Incomplete  Culture, blood (routine x 2)     Status: None (Preliminary result)   Collection Time: 04/06/15  6:40 PM  Result Value Ref Range Status   Specimen Description BLOOD RIGHT HAND  Final   Special Requests BOTTLES DRAWN AEROBIC AND ANAEROBIC 10 CC  Final   Culture   Final    NO GROWTH < 24 HOURS Performed at [facility]    Report Status PENDING  Incomplete     Studies: Dg Chest Port 1 View  04/06/2015  CLINICAL DATA:  Fever today. EXAM: PORTABLE CHEST 1 VIEW COMPARISON:  04/01/2015. FINDINGS: The heart is mildly enlarged but stable. Stable tortuosity and ectasia of the thoracic aorta. Patchy bilateral airspace opacities most likely reflecting pneumonia. No pleural effusion. The bony thorax is intact. IMPRESSION: Patchy bilateral lung infiltrates. Electronically Signed   By: Marijo Sanes M.D.   On: 04/06/2015 19:26    Scheduled Meds: . antiseptic oral rinse  7 mL Mouth Rinse q12n4p  . atorvastatin  20 mg Oral QHS  . benztropine  0.5 mg Oral BID  . chlorhexidine  15 mL Mouth/Throat BID  . divalproex  500 mg Oral BID  . escitalopram  10 mg Oral Daily  . feeding supplement (ENSURE ENLIVE)  237 mL Oral TID BM  . omega-3 acid ethyl esters  2 g Oral BID  . piperacillin-tazobactam (ZOSYN)  IV  3.375 g Intravenous 3 times per day  . pneumococcal 23 valent vaccine  0.5 mL Intramuscular Tomorrow-1000   . QUEtiapine  50 mg Oral BID   Continuous Infusions: . dextrose 100 mL/hr at 04/06/15 2133    Principal Problem:   Sepsis ([place]) Active Problems:   Hospital-acquired pneumonia   Mental retardation   Hyperlipidemia   Parkinson's disease ([place])   AKI (acute kidney injury) ([place])   Pressure ulcer   E coli bacteremia   Hypernatremia   Thrombocytopenia ([facility])   E. coli UTI   Acute renal failure ([place])   Essential hypertension   Dysphagia   Palliative care encounter    Time spent: 35 minutes.   Vernell Leep, MD, FACP, FHM. [facility] Pager [phone]  If 7PM-7AM, please contact night-coverage [email or web address] Password [email or web address] 04/07/2015, 6:33 PM    LOS: 8 days

## 2015-04-07 NOTE — Progress Notes (Signed)
Pt's oxygen sats on venti mask was 100 percent on 55%.  Pt would try to take remove mask, so I removed it and applied 4 liters of oxygen.  Pt sats stayed high at 95%.

## 2015-04-07 NOTE — Progress Notes (Signed)
Pt continuing to have high fevers even with Tylenol, his oxygen sats 89 on 5 liters oxygen.  Received order for Zosyn, venti mask, and Tylenol per rectum.  Will continue to monitor.

## 2015-04-08 LAB — CBC
HEMATOCRIT: 33 % — AB (ref 39.0–52.0)
HEMOGLOBIN: 10.4 g/dL — AB (ref 13.0–17.0)
MCH: 31.7 pg (ref 26.0–34.0)
MCHC: 31.5 g/dL (ref 30.0–36.0)
MCV: 100.6 fL — AB (ref 78.0–100.0)
Platelets: 190 10*3/uL (ref 150–400)
RBC: 3.28 MIL/uL — ABNORMAL LOW (ref 4.22–5.81)
RDW: 15.3 % (ref 11.5–15.5)
WBC: 11 10*3/uL — ABNORMAL HIGH (ref 4.0–10.5)

## 2015-04-08 LAB — BASIC METABOLIC PANEL
ANION GAP: 6 (ref 5–15)
BUN: 40 mg/dL — AB (ref 6–20)
CHLORIDE: 109 mmol/L (ref 101–111)
CO2: 31 mmol/L (ref 22–32)
Calcium: 7.7 mg/dL — ABNORMAL LOW (ref 8.9–10.3)
Creatinine, Ser: 1.41 mg/dL — ABNORMAL HIGH (ref 0.61–1.24)
GFR calc Af Amer: 56 mL/min — ABNORMAL LOW (ref >60)
GFR calc non Af Amer: 48 mL/min — ABNORMAL LOW (ref >60)
GLUCOSE: 120 mg/dL — AB (ref 65–99)
POTASSIUM: 3.7 mmol/L (ref 3.5–5.1)
Sodium: 146 mmol/L — ABNORMAL HIGH (ref 135–145)

## 2015-04-08 NOTE — Progress Notes (Signed)
[facility] PROGRESS NOTE  Gary [name] VQM:[id number] DOB: [birth year] DOA: 03/30/2015 PCP: Maylon Peppers, MD  Brief Interval history Gary [name] is an 75 y.o. male with a PMH of mental retardation, from [facility], who was sent to the ED for evaluation of altered mental status, hypoxia and low BP. Upon initial evaluation in the ED, he was found to be febrile, hypotensive, and with AKI. CXR shows pneumonia. Patient also noted to have a Escherichia coli bacteremia and Escherichia coli UTI. IV antibiotics have been narrowed to IV Rocephin. Patient still spiking fevers. ID has been consulted and is following. Palliative care on board as well.    Assessment/Plan: #1 sepsis secondary to Escherichia coli bacteremia, Escherichia coli UTI, aspiration pneumonia versus HCAP Patient intermittently spiking fevers. Leukocytosis resolved. Patient has been evaluated by speech therapy and patient with chronic aspiration risks which are high for modified barium swallow. Patient with poor outcomes with the sofa score of 2-3. Pro calcitonin lactic acid was elevated on admission. Patient has been on several different IV antibiotics since admission as outlined below. Switched to IV Zosyn 04/05/14 due to ongoing temperature spikes. Repeat blood cultures 21/5: Negative to date. Chest x-ray 1/5: Patchy bilateral lung infiltrates. Continue supportive care. Continue dysphagia 1 diet with aspiration precautions. Follow.  Discussed with palliative care team on 1/6: They have met with patient's nephew today and indicate that patient wishes to continue management over the weekend and regroup on Monday. However if patient declines over the weekend, he is apparently open to transitioning to comfort oriented care.  - Seems to have defervesced on IV Zosyn. Last high fever 101.8 on 1/5 at midnight.  #2 Escherichia coli bacteremia Likely seeded from the urine as patient also with a Escherichia coli  UTI. Patient has been on several appropriate IV Abx since admission. Patient still spiking fevers. Patient with increased aspiration risk. ID following.  As above  #3 Escherichia coli UTI As above  #4 probable aspiration pneumonia versus HCAP Patient has been assessed by speech therapy and patient underwent a modified barium swallow, patient noted to be a severe aspiration risk and likely has been chronically aspirating. Patient currently on a dysphagia 1 diet with aspiration precautions. Abx Mx as above Hypoxia overnight likely related to this and possible ongoing aspiration - Improved. As per nursing, does not keep on oxygen consistently.  #5 hyperkalemia/hypernatremia Hyperkalemia has resolved. Patient now hypernatremic with a sodium of 149>151. D5W. BMET in the morning. Hypernatremia is better. Continue hypotonic IV fluids.  #6 acute renal failure Likely secondary to prerenal azotemia as patient was noted to be hypotensive on admission. Renal function trending down. Continue hydration. Follow. Improving.   #7 probable Parkinson's disease Unclear diagnosis. Nephew was not aware of this diagnosis. Patient not on any medications.  #8 pressure ulcer Present on admission. Skin care per wound care nurse and recommendations.  #9 hyperlipidemia Continue statins. Hold fish oil.  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 Depakote levels were therapeutic. Hold morning dose of Seroquel as patient sleeping.  #11 thrombocytopenia No evidence of bleeding. Lovenox had been discontinued. Platelet count trending back up. HIT panel pending.  #13 prognosis Patient with a poor prognosis with a history of mental retardation living in a senior living center presenting with probable aspiration pneumonia versus HCAP noted to be septic source likely Escherichia coli bacteremia and Escherichia coli UTI. Patient also noted to have an acute  renal failure. Patient has been  assessed by speech therapy and deemed to be severe aspiration risk. Patient on dysphagia 1 diet with aspiration precautions. Patient on empiric IV antibiotics. Palliative care consultation pending for goals of care - as above, Changed to DNR today.  DVT prophylaxis: SCDs Code Status: DNR Family Communication: No family at bedside. Disposition Plan: To be determined.   Consultants:  Palliative care  ID : Dr Baxter Flattery 04/05/2015  Procedures:  Modified barium swallow 04/04/2015  Chest x-ray 03/30/2015, 04/01/2015  Foley catheter.  Antibiotics:  IV cefepime 03/30/2015>>>> 04/02/2015  IV Ancef 04/03/2015>>>> 04/05/2015  IV Rocephin 04/05/2015> DC';ed  IV vancomycin 03/30/2015>>>> 03/31/2015  IV Zosyn 1/5>  HPI/Subjective: Patient non verbal- looks around. Discussed with RN. No acute issues.   Objective:  Filed Vitals:   04/07/15 0504 04/07/15 1347 04/07/15 2206 04/08/15 0525  BP: 119/58 124/60 122/80 127/56  Pulse: 58 57 77 64  Temp: 97.8 F (36.6 C) 98.7 F (37.1 C) 99.5 F (37.5 C) 98.9 F (37.2 C)  TempSrc: Axillary Axillary Axillary Axillary  Resp: _0 Height:      Weight:      SpO2: 100% 100% 100% 92%     Intake/Output Summary (Last 24 hours) at 04/08/15 1228 Last data filed at 04/08/15 1222  Gross per 24 hour  Intake   2650 ml  Output   3151 ml  Net   -501 ml   Filed Weights   03/30/15 1327 03/31/15 0400  Weight: 68.04 kg (150 lb) 62.4 kg (137 lb 9.1 oz)    Exam:   General:  Pleasant elderly male sitting comfortably propped up in bed without distress. Awake and alert. Makes some sound but does not verbalize. Keeps sticking tongue out intermittently.   Cardiovascular: RRR. No JVD or pedal edema.   Respiratory: Diminished breath sounds in the bases but otherwise clear to auscultation. No increased work of breathing.  Abdomen: Soft, nontender, nondistended, positive bowel sounds.Foley catheter +.    Musculoskeletal: No clubbing cyanosis or edema.  CNS: alert. Not oriented and does not follow commands.  Data Reviewed: Basic Metabolic Panel:  Recent Labs Lab 04/04/15 0516 04/05/15 0446 04/06/15 0500 04/07/15 0427 04/08/15 0526  NA 144 145 149* 151* 146*  K 4.5 4.4 4.4 4.3 3.7  CL 115* 112* 116* 113* 109  CO2 _1 GLUCOSE 123* 90 94 106* 120*  BUN 73* 60* 55* 45* 40*  CREATININE 2.31* 2.05* 1.77* 1.75* 1.41*  CALCIUM 7.9* 8.1* 8.3* 8.1* 7.7*   Liver Function Tests: No results for input(s): AST, ALT, ALKPHOS, BILITOT, PROT, ALBUMIN in the last 168 hours. No results for input(s): LIPASE, AMYLASE in the last 168 hours. No results for input(s): AMMONIA in the last 168 hours. CBC:  Recent Labs Lab 04/04/15 0516 04/05/15 0446 04/06/15 0500 04/07/15 0427 04/08/15 0526  WBC 14.7* 14.7* 13.8* 10.4 11.0*  NEUTROABS  --   --  11.1*  --   --   HGB 12.3* 12.0* 11.2* 10.4* 10.4*  HCT 38.6* 37.7* 34.6* 32.9* 33.0*  MCV 98.7 98.7 98.0 99.7 100.6*  PLT 49* 64* 87* 124* 190   Cardiac Enzymes: No results for input(s): CKTOTAL, CKMB, CKMBINDEX, TROPONINI in the last 168 hours. BNP (last 3 results)  Recent Labs  12/07/14 1906  BNP 44.9    ProBNP (last 3 results) No results for input(s): PROBNP in the last 8760 hours.  CBG:  Recent Labs Lab 04/07/15 0004  GLUCAP 105*    Recent Results (from the  past 240 hour(s))  Urine culture     Status: None   Collection Time: 03/30/15  1:31 PM  Result Value Ref Range Status   Specimen Description URINE, CATHETERIZED  Final   Special Requests NONE  Final   Culture   Final    >=100,000 COLONIES/mL ESCHERICHIA COLI Performed at [facility]    Report Status 04/01/2015 FINAL  Final   Organism ID, Bacteria ESCHERICHIA COLI  Final      Susceptibility   Escherichia coli - MIC*    AMPICILLIN <=2 SENSITIVE Sensitive     CEFAZOLIN <=4 SENSITIVE Sensitive     CEFTRIAXONE <=1 SENSITIVE Sensitive      CIPROFLOXACIN <=0.25 SENSITIVE Sensitive     GENTAMICIN 2 SENSITIVE Sensitive     IMIPENEM <=0.25 SENSITIVE Sensitive     NITROFURANTOIN <=16 SENSITIVE Sensitive     TRIMETH/SULFA <=20 SENSITIVE Sensitive     AMPICILLIN/SULBACTAM <=2 SENSITIVE Sensitive     PIP/TAZO <=4 SENSITIVE Sensitive     * >=100,000 COLONIES/mL ESCHERICHIA COLI  Culture, blood (routine x 2)     Status: None   Collection Time: 03/30/15  1:40 PM  Result Value Ref Range Status   Specimen Description BLOOD LEFT WRIST  Final   Special Requests BOTTLES DRAWN AEROBIC AND ANAEROBIC 5 CC EA  Final   Culture  Setup Time   Final    GRAM NEGATIVE RODS IN BOTH AEROBIC AND ANAEROBIC BOTTLES CRITICAL RESULT CALLED TO, READ BACK BY AND VERIFIED WITH: A KOONTZ 0727 03/31/15 MKELLY    Culture   Final    ESCHERICHIA COLI Performed at [facility]    Report Status 04/02/2015 FINAL  Final   Organism ID, Bacteria ESCHERICHIA COLI  Final      Susceptibility   Escherichia coli - MIC*    AMPICILLIN <=2 SENSITIVE Sensitive     CEFAZOLIN <=4 SENSITIVE Sensitive     CEFEPIME <=1 SENSITIVE Sensitive     CEFTAZIDIME <=1 SENSITIVE Sensitive     CEFTRIAXONE <=1 SENSITIVE Sensitive     CIPROFLOXACIN <=0.25 SENSITIVE Sensitive     GENTAMICIN <=1 SENSITIVE Sensitive     IMIPENEM <=0.25 SENSITIVE Sensitive     TRIMETH/SULFA <=20 SENSITIVE Sensitive     AMPICILLIN/SULBACTAM <=2 SENSITIVE Sensitive     PIP/TAZO <=4 SENSITIVE Sensitive     * ESCHERICHIA COLI  Culture, blood (routine x 2)     Status: None   Collection Time: 03/30/15  1:50 PM  Result Value Ref Range Status   Specimen Description BLOOD LEFT FOREARM  Final   Special Requests BOTTLES DRAWN AEROBIC AND ANAEROBIC 5 CC EA  Final   Culture  Setup Time   Final    GRAM NEGATIVE RODS IN BOTH AEROBIC AND ANAEROBIC BOTTLES CRITICAL RESULT CALLED TO, READ BACK BY AND VERIFIED WITH: A. KOONTZ _0  03/31/15 MKELLY    Culture   Final    ESCHERICHIA COLI SUSCEPTIBILITIES  PERFORMED ON PREVIOUS CULTURE WITHIN THE LAST 5 DAYS. Performed at [facility]    Report Status 04/02/2015 FINAL  Final  MRSA PCR Screening     Status: None   Collection Time: 03/30/15  4:40 PM  Result Value Ref Range Status   MRSA by PCR NEGATIVE NEGATIVE Final    Comment:        The GeneXpert MRSA Assay (FDA approved for NASAL specimens only), is one component of a comprehensive MRSA colonization surveillance program. It is not intended to diagnose MRSA infection nor to guide  or monitor treatment for MRSA infections.   Culture, blood (routine x 2)     Status: None (Preliminary result)   Collection Time: 04/06/15  6:35 PM  Result Value Ref Range Status   Specimen Description BLOOD RIGHT HAND  Final   Special Requests BOTTLES DRAWN AEROBIC AND ANAEROBIC 10 CC  Final   Culture   Final    NO GROWTH 2 DAYS Performed at [facility]    Report Status PENDING  Incomplete  Culture, blood (routine x 2)     Status: None (Preliminary result)   Collection Time: 04/06/15  6:40 PM  Result Value Ref Range Status   Specimen Description BLOOD RIGHT HAND  Final   Special Requests BOTTLES DRAWN AEROBIC AND ANAEROBIC 10 CC  Final   Culture   Final    NO GROWTH 2 DAYS Performed at [facility]    Report Status PENDING  Incomplete     Studies: Dg Chest Port 1 View  04/06/2015  CLINICAL DATA:  Fever today. EXAM: PORTABLE CHEST 1 VIEW COMPARISON:  04/01/2015. FINDINGS: The heart is mildly enlarged but stable. Stable tortuosity and ectasia of the thoracic aorta. Patchy bilateral airspace opacities most likely reflecting pneumonia. No pleural effusion. The bony thorax is intact. IMPRESSION: Patchy bilateral lung infiltrates. Electronically Signed   By: Marijo Sanes M.D.   On: 04/06/2015 19:26    Scheduled Meds: . antiseptic oral rinse  7 mL Mouth Rinse q12n4p  . atorvastatin  20 mg Oral QHS  . benztropine  0.5 mg Oral BID  . chlorhexidine  15 mL Mouth/Throat BID  .  divalproex  500 mg Oral BID  . escitalopram  10 mg Oral Daily  . feeding supplement (ENSURE ENLIVE)  237 mL Oral TID BM  . omega-3 acid ethyl esters  2 g Oral BID  . piperacillin-tazobactam (ZOSYN)  IV  3.375 g Intravenous 3 times per day  . pneumococcal 23 valent vaccine  0.5 mL Intramuscular Tomorrow-1000  . QUEtiapine  50 mg Oral BID   Continuous Infusions: . dextrose 100 mL/hr at 04/08/15 0149    Principal Problem:   Sepsis ([place]) Active Problems:   Hospital-acquired pneumonia   Mental retardation   Hyperlipidemia   Parkinson's disease ([place])   AKI (acute kidney injury) ([place])   Pressure ulcer   E coli bacteremia   Hypernatremia   Thrombocytopenia ([facility])   E. coli UTI   Acute renal failure [facility])   Essential hypertension   Dysphagia   Palliative care encounter    Time spent: 15 minutes.   Vernell Leep, MD, FACP, FHM. [facility] Pager [phone]  If 7PM-7AM, please contact night-coverage [email or web address] Password [email or web address] 04/08/2015, 12:28 PM    LOS: 9 days

## 2015-04-09 LAB — BASIC METABOLIC PANEL
ANION GAP: 7 (ref 5–15)
BUN: 33 mg/dL — AB (ref 6–20)
CALCIUM: 7.8 mg/dL — AB (ref 8.9–10.3)
CO2: 33 mmol/L — ABNORMAL HIGH (ref 22–32)
CREATININE: 1.5 mg/dL — AB (ref 0.61–1.24)
Chloride: 108 mmol/L (ref 101–111)
GFR calc Af Amer: 52 mL/min — ABNORMAL LOW (ref >60)
GFR, EST NON AFRICAN AMERICAN: 45 mL/min — AB (ref >60)
GLUCOSE: 93 mg/dL (ref 65–99)
Potassium: 3.7 mmol/L (ref 3.5–5.1)
Sodium: 148 mmol/L — ABNORMAL HIGH (ref 135–145)

## 2015-04-09 MED ORDER — AMOXICILLIN-POT CLAVULANATE 875-125 MG PO TABS
1.0000 | ORAL_TABLET | Freq: Two times a day (BID) | ORAL | Status: DC
  Administered 2015-04-09 – 2015-04-11 (×5): 1 via ORAL
  Filled 2015-04-09 (×6): qty 1

## 2015-04-09 MED ORDER — DEXTROSE 5 % IV SOLN
INTRAVENOUS | Status: AC
  Administered 2015-04-09 – 2015-04-10 (×4): via INTRAVENOUS

## 2015-04-09 NOTE — Progress Notes (Addendum)
[facility] PROGRESS NOTE  Gary [name] HQI:[id number] DOB: 05-27-1940 DOA: 03/30/2015 PCP: Maylon Peppers, MD  Brief Interval history Gary [name] is an 75 y.o. male with a PMH of mental retardation, from [facility], who was sent to the ED for evaluation of altered mental status, hypoxia and low BP. Upon initial evaluation in the ED, he was found to be febrile, hypotensive, and with AKI. CXR shows pneumonia. Patient also noted to have a Escherichia coli bacteremia and Escherichia coli UTI. IV antibiotics were narrowed to IV Rocephin but patient still spiking fevers. ID was consulted and is following. Palliative care on board as well.   Assessment/Plan: #1 sepsis secondary to Escherichia coli bacteremia, Escherichia coli UTI, aspiration pneumonia versus HCAP Patient intermittently spiking fevers. Leukocytosis resolved. Patient has been evaluated by speech therapy and patient with chronic aspiration risks. Patient with poor outcomes with the sofa score of 2-3. Pro calcitonin & lactic acid was elevated on admission. Patient has been on several different IV antibiotics since admission as outlined below. Switched to IV Zosyn 04/05/14 due to ongoing temperature spikes. Repeat blood cultures 21/5: Negative to date. Chest x-ray 1/5: Patchy bilateral lung infiltrates. Continue supportive care. Continue dysphagia 1 diet with aspiration precautions. Follow.  Discussed with palliative care team on 1/6: They have met with patient's nephew and indicate that he wishes to continue management over the weekend and regroup on Monday. However if patient declines over the weekend, he is apparently open to transitioning to comfort oriented care.  - Seems to have defervesced on IV Zosyn. Last high fever 101.8 on 1/5 at midnight. - Discussed with Dr. Baxter Flattery, infectious disease on 1/8: Recommended Augmentin 875 MG twice a day for additional 5 days to complete treatment for Escherichia coli  bacteremia and this will also cover for aspiration pneumonia.  #2 Escherichia coli bacteremia Likely seeded from Escherichia coli UTI. Patient has been on several appropriate IV Abx since admission. Patient was spiking fevers. Patient with increased aspiration risk. ID following.  Abx as above  #3 Escherichia coli UTI As above - likely completed treatment for this indication.  #4 probable aspiration pneumonia versus HCAP Patient has been assessed by speech therapy and patient underwent a modified barium swallow, patient noted to be a severe aspiration risk and likely has been chronically aspirating. Patient currently on a dysphagia 1 diet with aspiration precautions. Abx Mx as above Hypoxia overnight 1/5, likely related to this and possible ongoing aspiration - Improved. As per nursing, does not keep on oxygen consistently.  #5 hyperkalemia/hypernatremia Hyperkalemia has resolved.  Patient persistently hypernatremic in the 146-151 range over the last 4 days despite being on hypotonic IV fluids. This may be secondary to decreased free water intake by mouth. We will increase IV D5 and follow BMP in a.m. Patient having multiple soft BMs but no frank diarrhea. As per RN, eating reasonably. Zosyn probably adding to sodium load-discontinued 1/8.  #6 acute renal failure Likely secondary to prerenal azotemia as patient was noted to be hypotensive on admission. Renal function trending down. Continue hydration. Follow. Creatinine has plateaued in the 1.4-1.5 range over the last 2 days.  #7 probable Parkinson's disease Unclear diagnosis. Nephew was not aware of this diagnosis. Patient not on any medications.  #8 pressure ulcer Present on admission. Skin care per wound care nurse and recommendations.  #9 hyperlipidemia Continue statins. Hold fish oil.  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 Depakote levels were therapeutic. Hold  morning dose of Seroquel as patient sleeping.  #11 thrombocytopenia No evidence of bleeding. Lovenox had been discontinued. Thrombocytopenia resolved on 1/7. HIT panel negative.  #13 prognosis Patient with a poor prognosis with a history of mental retardation living in a senior living center presenting with probable aspiration pneumonia versus HCAP noted to be septic source likely Escherichia coli bacteremia and Escherichia coli UTI. Patient also noted to have an acute renal failure. Patient has been assessed by speech therapy and deemed to be severe aspiration risk. Patient on dysphagia 1 diet with aspiration precautions. Patient on empiric IV antibiotics. Palliative care consultation pending for goals of care - as above, Changed to DNR.  DVT prophylaxis: SCDs Code Status: DNR Family Communication: No family at bedside. Disposition Plan: To be determined.   Consultants:  Palliative care  Infectious disease  Procedures:  Modified barium swallow 04/04/2015  Chest x-ray 03/30/2015, 04/01/2015  Foley catheter-DC'd 1/8.  Antibiotics:  IV cefepime 03/30/2015>>>> 04/02/2015  IV Ancef 04/03/2015>>>> 04/05/2015  IV Rocephin 04/05/2015> DC';ed  IV vancomycin 03/30/2015>>>> 03/31/2015  IV Zosyn 1/5>  HPI/Subjective: Patient appears pleasant and comfortable. Mumbles incomprehensively. As per RN, Foley catheter was placed this admission. 3-4 soft BMs but not watery. Eating reasonably.  Objective:  Filed Vitals:   04/08/15 0525 04/08/15 1312 04/08/15 2155 04/09/15 0450  BP: 127/56 106/47 128/62 128/67  Pulse: 64 70 76 58  Temp: 98.9 F (37.2 C) 98.9 F (37.2 C) 99.5 F (37.5 C) 99.1 F (37.3 C)  TempSrc: Axillary Oral Oral Oral  Resp: '16 17 16 16  ' Height:      Weight:      SpO2: 92% 92% 92% 94%     Intake/Output Summary (Last 24 hours) at 04/09/15 1306 Last data filed at 04/09/15 1050  Gross per 24 hour  Intake   1800 ml  Output   1358 ml  Net    442 ml   Filed  Weights   03/30/15 1327 03/31/15 0400  Weight: 68.04 kg (150 lb) 62.4 kg (137 lb 9.1 oz)    Exam:   General:  Pleasant elderly male lying comfortably supine in bed without distress. Nursing aide tiding him. Awake and alert. Makes some sound but does not verbalize. Keeps sticking tongue out intermittently.   Cardiovascular: RRR. No JVD or pedal edema.   Respiratory: Diminished breath sounds in the bases but otherwise clear to auscultation. No increased work of breathing.  Abdomen: Soft, nontender, nondistended, positive bowel sounds.Foley catheter +.   Musculoskeletal: No clubbing cyanosis or edema.  CNS: alert. Not oriented and does not follow commands.  Data Reviewed: Basic Metabolic Panel:  Recent Labs Lab 04/05/15 0446 04/06/15 0500 04/07/15 0427 04/08/15 0526 04/09/15 0453  NA 145 149* 151* 146* 148*  K 4.4 4.4 4.3 3.7 3.7  CL 112* 116* 113* 109 108  CO2 '24 26 31 31 ' 33*  GLUCOSE 90 94 106* 120* 93  BUN 60* 55* 45* 40* 33*  CREATININE 2.05* 1.77* 1.75* 1.41* 1.50*  CALCIUM 8.1* 8.3* 8.1* 7.7* 7.8*   Liver Function Tests: No results for input(s): AST, ALT, ALKPHOS, BILITOT, PROT, ALBUMIN in the last 168 hours. No results for input(s): LIPASE, AMYLASE in the last 168 hours. No results for input(s): AMMONIA in the last 168 hours. CBC:  Recent Labs Lab 04/04/15 0516 04/05/15 0446 04/06/15 0500 04/07/15 0427 04/08/15 0526  WBC 14.7* 14.7* 13.8* 10.4 11.0*  NEUTROABS  --   --  11.1*  --   --   HGB 12.3*  12.0* 11.2* 10.4* 10.4*  HCT 38.6* 37.7* 34.6* 32.9* 33.0*  MCV 98.7 98.7 98.0 99.7 100.6*  PLT 49* 64* 87* 124* 190   Cardiac Enzymes: No results for input(s): CKTOTAL, CKMB, CKMBINDEX, TROPONINI in the last 168 hours. BNP (last 3 results)  Recent Labs  12/07/14 1906  BNP 44.9    ProBNP (last 3 results) No results for input(s): PROBNP in the last 8760 hours.  CBG:  Recent Labs Lab 04/07/15 0004  GLUCAP 105*    Recent Results (from the past  240 hour(s))  Urine culture     Status: None   Collection Time: 03/30/15  1:31 PM  Result Value Ref Range Status   Specimen Description URINE, CATHETERIZED  Final   Special Requests NONE  Final   Culture   Final    >=100,000 COLONIES/mL ESCHERICHIA COLI Performed at [facility]    Report Status 04/01/2015 FINAL  Final   Organism ID, Bacteria ESCHERICHIA COLI  Final      Susceptibility   Escherichia coli - MIC*    AMPICILLIN <=2 SENSITIVE Sensitive     CEFAZOLIN <=4 SENSITIVE Sensitive     CEFTRIAXONE <=1 SENSITIVE Sensitive     CIPROFLOXACIN <=0.25 SENSITIVE Sensitive     GENTAMICIN 2 SENSITIVE Sensitive     IMIPENEM <=0.25 SENSITIVE Sensitive     NITROFURANTOIN <=16 SENSITIVE Sensitive     TRIMETH/SULFA <=20 SENSITIVE Sensitive     AMPICILLIN/SULBACTAM <=2 SENSITIVE Sensitive     PIP/TAZO <=4 SENSITIVE Sensitive     * >=100,000 COLONIES/mL ESCHERICHIA COLI  Culture, blood (routine x 2)     Status: None   Collection Time: 03/30/15  1:40 PM  Result Value Ref Range Status   Specimen Description BLOOD LEFT WRIST  Final   Special Requests BOTTLES DRAWN AEROBIC AND ANAEROBIC 5 CC EA  Final   Culture  Setup Time   Final    GRAM NEGATIVE RODS IN BOTH AEROBIC AND ANAEROBIC BOTTLES CRITICAL RESULT CALLED TO, READ BACK BY AND VERIFIED WITH: A KOONTZ 0727 03/31/15 MKELLY    Culture   Final    ESCHERICHIA COLI Performed at [facility]    Report Status 04/02/2015 FINAL  Final   Organism ID, Bacteria ESCHERICHIA COLI  Final      Susceptibility   Escherichia coli - MIC*    AMPICILLIN <=2 SENSITIVE Sensitive     CEFAZOLIN <=4 SENSITIVE Sensitive     CEFEPIME <=1 SENSITIVE Sensitive     CEFTAZIDIME <=1 SENSITIVE Sensitive     CEFTRIAXONE <=1 SENSITIVE Sensitive     CIPROFLOXACIN <=0.25 SENSITIVE Sensitive     GENTAMICIN <=1 SENSITIVE Sensitive     IMIPENEM <=0.25 SENSITIVE Sensitive     TRIMETH/SULFA <=20 SENSITIVE Sensitive     AMPICILLIN/SULBACTAM <=2 SENSITIVE  Sensitive     PIP/TAZO <=4 SENSITIVE Sensitive     * ESCHERICHIA COLI  Culture, blood (routine x 2)     Status: None   Collection Time: 03/30/15  1:50 PM  Result Value Ref Range Status   Specimen Description BLOOD LEFT FOREARM  Final   Special Requests BOTTLES DRAWN AEROBIC AND ANAEROBIC 5 CC EA  Final   Culture  Setup Time   Final    GRAM NEGATIVE RODS IN BOTH AEROBIC AND ANAEROBIC BOTTLES CRITICAL RESULT CALLED TO, READ BACK BY AND VERIFIED WITH: A. KOONTZ '@0729'  03/31/15 MKELLY    Culture   Final    ESCHERICHIA COLI SUSCEPTIBILITIES PERFORMED ON PREVIOUS CULTURE WITHIN THE LAST  5 DAYS. Performed at [facility]    Report Status 04/02/2015 FINAL  Final  MRSA PCR Screening     Status: None   Collection Time: 03/30/15  4:40 PM  Result Value Ref Range Status   MRSA by PCR NEGATIVE NEGATIVE Final    Comment:        The GeneXpert MRSA Assay (FDA approved for NASAL specimens only), is one component of a comprehensive MRSA colonization surveillance program. It is not intended to diagnose MRSA infection nor to guide or monitor treatment for MRSA infections.   Culture, blood (routine x 2)     Status: None (Preliminary result)   Collection Time: 04/06/15  6:35 PM  Result Value Ref Range Status   Specimen Description BLOOD RIGHT HAND  Final   Special Requests BOTTLES DRAWN AEROBIC AND ANAEROBIC 10 CC  Final   Culture   Final    NO GROWTH 3 DAYS Performed at [facility]    Report Status PENDING  Incomplete  Culture, blood (routine x 2)     Status: None (Preliminary result)   Collection Time: 04/06/15  6:40 PM  Result Value Ref Range Status   Specimen Description BLOOD RIGHT HAND  Final   Special Requests BOTTLES DRAWN AEROBIC AND ANAEROBIC 10 CC  Final   Culture   Final    NO GROWTH 3 DAYS Performed at [facility]    Report Status PENDING  Incomplete     Studies: No results found.  Scheduled Meds: . antiseptic oral rinse  7 mL Mouth Rinse q12n4p   . atorvastatin  20 mg Oral QHS  . benztropine  0.5 mg Oral BID  . chlorhexidine  15 mL Mouth/Throat BID  . divalproex  500 mg Oral BID  . escitalopram  10 mg Oral Daily  . feeding supplement (ENSURE ENLIVE)  237 mL Oral TID BM  . omega-3 acid ethyl esters  2 g Oral BID  . piperacillin-tazobactam (ZOSYN)  IV  3.375 g Intravenous 3 times per day  . pneumococcal 23 valent vaccine  0.5 mL Intramuscular Tomorrow-1000  . QUEtiapine  50 mg Oral BID   Continuous Infusions: . dextrose 100 mL/hr at 04/08/15 1308    Principal Problem:   Sepsis ([place]) Active Problems:   Hospital-acquired pneumonia   Mental retardation   Hyperlipidemia   Parkinson's disease ([place])   AKI (acute kidney injury) ([place])   Pressure ulcer   E coli bacteremia   Hypernatremia   Thrombocytopenia ([facility])   E. coli UTI   Acute renal failure ([place])   Essential hypertension   Dysphagia   Palliative care encounter    Time spent: 15 minutes.   Vernell Leep, MD, FACP, FHM. [facility] Pager [phone]  If 7PM-7AM, please contact night-coverage [email or web address] Password [email or web address] 04/09/2015, 1:06 PM    LOS: 10 days

## 2015-04-10 DIAGNOSIS — N390 Urinary tract infection, site not specified: Secondary | ICD-10-CM

## 2015-04-10 DIAGNOSIS — R1310 Dysphagia, unspecified: Secondary | ICD-10-CM

## 2015-04-10 DIAGNOSIS — R7881 Bacteremia: Secondary | ICD-10-CM

## 2015-04-10 DIAGNOSIS — R0600 Dyspnea, unspecified: Secondary | ICD-10-CM

## 2015-04-10 DIAGNOSIS — I10 Essential (primary) hypertension: Secondary | ICD-10-CM

## 2015-04-10 DIAGNOSIS — R509 Fever, unspecified: Secondary | ICD-10-CM | POA: Insufficient documentation

## 2015-04-10 LAB — BASIC METABOLIC PANEL
ANION GAP: 7 (ref 5–15)
BUN: 28 mg/dL — ABNORMAL HIGH (ref 6–20)
CALCIUM: 7.8 mg/dL — AB (ref 8.9–10.3)
CHLORIDE: 106 mmol/L (ref 101–111)
CO2: 34 mmol/L — AB (ref 22–32)
Creatinine, Ser: 1.3 mg/dL — ABNORMAL HIGH (ref 0.61–1.24)
GFR calc non Af Amer: 53 mL/min — ABNORMAL LOW (ref >60)
Glucose, Bld: 124 mg/dL — ABNORMAL HIGH (ref 65–99)
POTASSIUM: 3.6 mmol/L (ref 3.5–5.1)
Sodium: 147 mmol/L — ABNORMAL HIGH (ref 135–145)

## 2015-04-10 NOTE — Progress Notes (Signed)
    [facility]    Date of Admission:  03/30/2015   Total days of antibiotics 12        Day 2 amox/clav           ID: Gary [name] is a 75 y.o. male with e.coli bacteremia and hcap Principal Problem:   Sepsis ([facility]) Active Problems:   Hospital-acquired pneumonia   Mental retardation   Hyperlipidemia   Parkinson's disease ([facility])   AKI (acute kidney injury) (H[facility])   Pressure ulcer   E coli bacteremia   Hypernatremia   Thrombocytopenia ([facility])   E. coli UTI   Acute renal failure ([facility])   Essential hypertension   Dysphagia   Palliative care encounter    Subjective: Afebrile for the past 3 days. Tolerating amox/clav  Medications:  . amoxicillin-clavulanate  1 tablet Oral Q12H  . antiseptic oral rinse  7 mL Mouth Rinse q12n4p  . atorvastatin  20 mg Oral QHS  . benztropine  0.5 mg Oral BID  . chlorhexidine  15 mL Mouth/Throat BID  . divalproex  500 mg Oral BID  . escitalopram  10 mg Oral Daily  . feeding supplement (ENSURE ENLIVE)  237 mL Oral TID BM  . omega-3 acid ethyl esters  2 g Oral BID  . pneumococcal 23 valent vaccine  0.5 mL Intramuscular Tomorrow-1000  . QUEtiapine  50 mg Oral BID    Objective: Vital signs in last 24 hours: Temp:  [98.4 F (36.9 C)-99 F (37.2 C)] 98.4 F (36.9 C) (01/09 0458) Pulse Rate:  [56-76] 76 (01/09 0458) Resp:  [18-20] 18 (01/09 0458) BP: (142-154)/(57-78) 153/78 mmHg (01/09 0458) SpO2:  [92 %-94 %] 92 % (01/09 0458)  Physical Exam  Constitutional: awake, answering simple questions with tongue protruded. No distress.  HENT:  Mouth/Throat: Oropharynx is clear and moist. No oropharyngeal exudate.  Cardiovascular: Normal rate, regular rhythm and normal heart sounds. Exam reveals no gallop and no friction rub.  No murmur heard.  Pulmonary/Chest: Effort normal and breath sounds normal. No respiratory distress. Bilateral course rhonchi Abdominal: Soft. Bowel sounds are normal. He exhibits no distension.  There is no tenderness.  Lymphadenopathy:  He has no cervical adenopathy.      Lab Results  Recent Labs  04/08/15 0526 04/09/15 0453 04/10/15 0425  WBC 11.0*  --   --   HGB 10.4*  --   --   HCT 33.0*  --   --   NA 146* 148* 147*  K 3.7 3.7 3.6  CL 109 108 106  CO2 31 33* 34*  BUN 40* 33* 28*  CREATININE 1.41* 1.50* 1.30*    Microbiology: 12/29 blood cx ecoli Studies/Results: Repeat cxr from 1/5 per my read, still has unchanged patchy bilateral infiltrate similar to 12/31  Assessment/Plan: ecoli bacteremia = he has received 12 days of IV abtx, switched 2 days ago to amox/clav for aspiration pneumonia  Aspiration pneumonia = continue with amox/clav for 4 more days to complete course  Fevers = defervesced. No other positive cultures.  aki = improving  Will sign off  Drue SecondSNIDER, [facility] LPCYNTHIA [facility] Cell: [phone] Pager: [phone]  04/10/2015, 1:24 PM

## 2015-04-10 NOTE — Progress Notes (Signed)
Physical Therapy Treatment Patient Details Name: Gary [name] MRN: [id number] DOB: 08/20/1940 Today's Date: 04/10/2015    History of Present Illness 75 yo male admitted with sepsis. Hx of mental retardation, HTN, Parkinson's. Pt is from ALF    PT Comments    Pt alert and following simple commands.  Expressed urgency to go to the bathroom, trying to get OOB without assist.  Assisted OOB to amb a limited distance of 8 feet to the bathroom + 2 hand held assist.  Pt is incont of urine but did have a BM in the toilet.  Required total care for hygiene.  "Gary [name]" was cooperative and followed instructions to stay on commode till a 2nd person was able to assist getting pt to amb back to bed.  Pt was able to sit EOB at Supervision level.  Did require assist B LE's to get back into bed.   Pt appears at/close to prior mobility/care level.   Follow Up Recommendations   ([facility] if they are able to provide appropriate level of care otherwise SNF)     Equipment Recommendations       Recommendations for Other Services       Precautions / Restrictions Precautions Precautions: Fall Precaution Comments: Hx MR Restrictions Weight Bearing Restrictions: No    Mobility  Bed Mobility Overal bed mobility: Needs Assistance Bed Mobility: Supine to Sit;Sit to Supine     Supine to sit: Mod assist Sit to supine: Max assist   General bed mobility comments: able to rise self due to urgency for bathroom.  Assisted OOB + 2 assist for safety but pt able 75%.  Assisted back to bed + 2 assist for safety but pt 75%.  Transfers Overall transfer level: Needs assistance Equipment used: 2 person hand held assist Transfers: Sit to/from Stand Sit to Stand: +2 physical assistance;Min assist         General transfer comment: pt able to partially stannd self briefly for touileting hygiene.  Requires more assist with direction esp turn completion/targeting toilet/bed.  Quick to sit.     Ambulation/Gait Ambulation/Gait assistance: +2 safety/equipment;Max assist Ambulation Distance (Feet): 16 Feet (8 feet x 2 to and from bathroom only. ) Assistive device: 2 person hand held assist Gait Pattern/deviations: Step-through pattern;Decreased stride length;Shuffle;Ataxic;Staggering left;Staggering right;Trunk flexed;Narrow base of support     General Gait Details: + 2 hand held assist urgency to get to the bathrrom.  Very unsteady.  Strong but poor balance and poor posture.  Unable to stand upright.  appears to use a wheelchair for primary mobility at ALF    Stairs            Wheelchair Mobility    Modified Rankin (Stroke Patients Only)       Balance                                    Cognition Arousal/Alertness: Awake/alert Behavior During Therapy: WFL for tasks assessed/performed Overall Cognitive Status: History of cognitive impairments - at baseline                      Exercises      General Comments        Pertinent Vitals/Pain Pain Assessment: No/denies pain    Home Living                      Prior  Function            PT Goals (current goals can now be found in the care plan section) Progress towards PT goals: Progressing toward goals    Frequency  Min 3X/week    PT Plan      Co-evaluation             End of Session Equipment Utilized During Treatment: Gait belt Activity Tolerance: Patient limited by fatigue Patient left: in bed;with call bell/phone within reach;with bed alarm set     Time: 1510-1535 PT Time Calculation (min) (ACUTE ONLY): 25 min  Charges:  $Gait Training: 8-22 mins $Therapeutic Activity: 8-22 mins                    G Codes:      Felecia Shelling  PTA [facility]  Acute  Rehab Pager      [phone]

## 2015-04-10 NOTE — Progress Notes (Signed)
[facility] PROGRESS NOTE  [name] NWG:[id number] DOB: 1940/09/18 DOA: 03/30/2015 PCP: Maylon Peppers, MD  HPI/Brief narrative Gary [name] is an 75 y.o. male with a PMH of mental retardation, from [facility], who was sent to the ED for evaluation of altered mental status, hypoxia and low BP. Upon initial evaluation in the ED, he was found to be febrile, hypotensive, and with AKI. CXR shows pneumonia. Patient also noted to have a Escherichia coli bacteremia and Escherichia coli UTI. IV antibiotics were narrowed to IV Rocephin but patient still spiking fevers. ID was consulted and is following. Palliative care on board as well.  Assessment/Plan: #1 sepsis secondary to Escherichia coli bacteremia, Escherichia coli UTI, aspiration pneumonia versus HCAP Patient intermittently spiking fevers. Leukocytosis resolved. Patient has been evaluated by speech therapy and patient with chronic aspiration risks. Patient with poor outcomes with the sofa score of 2-3. Pro calcitonin & lactic acid was elevated on admission. Patient has been on several different IV antibiotics since admission as outlined below. Switched to IV Zosyn 04/05/14 due to ongoing temperature spikes. Repeat blood cultures 21/5: Negative to date. Chest x-ray 1/5: Patchy bilateral lung infiltrates. Continued supportive care. Continue dysphagia 1 diet with aspiration precautions. Follow.  Rounding team had discussed with palliative care team on 1/6: They have met with patient's nephew and indicate that he wishes to continue management over the weekend and regroup on today. However if patient declines over the weekend, he is apparently open to transitioning to comfort oriented care.  - Seems to have defervesced on IV Zosyn. Last high fever 101.8 on 1/5 at midnight. - Dr. Algis Liming had discussed with Dr. Baxter Flattery, infectious disease on 1/8: Recommended Augmentin 875 MG twice a day for additional 5 days to complete  treatment for Escherichia coli bacteremia and this will also cover for aspiration pneumonia. Treatment course is for 4 more days. ID has since signed off  #2 Escherichia coli bacteremia Likely seeded from Escherichia coli UTI. Patient has been on several appropriate IV Abx since admission. Patient was spiking fevers. Patient with increased aspiration risk. ID following.  Abx as above  #3 Escherichia coli UTI As above - likely completed treatment for this indication.  #4 probable aspiration pneumonia versus HCAP Patient has been assessed by speech therapy and patient underwent a modified barium swallow, patient noted to be a severe aspiration risk and likely has been chronically aspirating. Patient currently on a dysphagia 1 diet with aspiration precautions. Abx Mx as above Hypoxia overnight 1/5, likely related to this and possible ongoing aspiration - Improved. As per nursing, does not keep on oxygen consistently.  #5 hyperkalemia/hypernatremia Hyperkalemia has resolved.  Patient persistently hypernatremic in the 146-151 range over the last 4 days despite being on hypotonic IV fluids. This may be secondary to decreased free water intake by mouth. We will increase IV D5 and follow BMP in a.m. Patient having multiple soft BMs but no frank diarrhea. As per RN, eating reasonably. Zosyn probably adding to sodium load-discontinued 1/8.  #6 acute renal failure Likely secondary to prerenal azotemia as patient was noted to be hypotensive on admission. Renal function trending down. Continue hydration. Follow. Creatinine has plateaued in the 1.4-1.5 range over the last 2 days.  #7 probable Parkinson's disease Unclear diagnosis. Nephew was not aware of this diagnosis. Patient not on any medications.  #8 pressure ulcer Present on admission. Skin care per wound care nurse and recommendations.  #9 hyperlipidemia Continue statins. Hold fish oil.  #10 mental  retardation with behavioral  disturbance Continue Depakote, Lexapro, Ativan as needed, Seroquel. Depakote levels were therapeutic. Hold morning dose of Seroquel as patient sleeping.  #11 thrombocytopenia No evidence of bleeding. Lovenox had been discontinued. Thrombocytopenia resolved on 1/7. HIT panel negative.  #13 prognosis Patient with a poor prognosis with a history of mental retardation living in a senior living center presenting with probable aspiration pneumonia versus HCAP noted to be septic source likely Escherichia coli bacteremia and Escherichia coli UTI. Patient also noted to have an acute renal failure. Patient has been assessed by speech therapy and deemed to be severe aspiration risk. Patient on dysphagia 1 diet with aspiration precautions. Patient on empiric IV antibiotics. Palliative care consultation pending for goals of care - during this admit, code status was changed to DNR.  Code Status: DNR Family Communication: Pt in room Disposition Plan: Possible SNF? To be determined pending palliative care input   Consultants:  Palliative care  Infectious disease  Procedures:  Modified barium swallow 04/04/2015  Chest x-ray 03/30/2015, 04/01/2015  Foley catheter-DC'd 1/8.  Antibiotics: Anti-infectives    Start     Dose/Rate Route Frequency Ordered Stop   04/09/15 1400  amoxicillin-clavulanate (AUGMENTIN) 875-125 MG per tablet 1 tablet     1 tablet Oral Every 12 hours 04/09/15 1324 04/14/15 0959   04/07/15 0015  piperacillin-tazobactam (ZOSYN) IVPB 3.375 g  Status:  Discontinued     3.375 g 12.5 mL/hr over 240 Minutes Intravenous 3 times per day 04/07/15 0004 04/09/15 1324   04/05/15 1000  cefTRIAXone (ROCEPHIN) 2 g in dextrose 5 % 50 mL IVPB  Status:  Discontinued     2 g 100 mL/hr over 30 Minutes Intravenous Every 24 hours 04/05/15 0831 04/06/15 2341   04/03/15 1000  ceFAZolin (ANCEF) IVPB 1 g/50 mL premix  Status:  Discontinued     1 g 100 mL/hr over 30 Minutes Intravenous Every 12 hours  04/02/15 1531 04/05/15 0831   03/31/15 1400  vancomycin (VANCOCIN) IVPB 1000 mg/200 mL premix  Status:  Discontinued     1,000 mg 200 mL/hr over 60 Minutes Intravenous Every 48 hours 03/30/15 1608 03/31/15 0918   03/31/15 1000  ceFEPIme (MAXIPIME) 1 g in dextrose 5 % 50 mL IVPB  Status:  Discontinued     1 g 100 mL/hr over 30 Minutes Intravenous Every 24 hours 03/30/15 1608 04/02/15 1531   03/30/15 1530  vancomycin (VANCOCIN) IVPB 1000 mg/200 mL premix     1,000 mg 200 mL/hr over 60 Minutes Intravenous STAT 03/30/15 1443 03/30/15 1626   03/30/15 1500  ceFEPIme (MAXIPIME) 1 g in dextrose 5 % 50 mL IVPB     1 g 100 mL/hr over 30 Minutes Intravenous  Once 03/30/15 1430 03/30/15 1526      HPI/Subjective: Pt denies complaints today. Is pleasant  Objective: Filed Vitals:   04/09/15 1426 04/09/15 2110 04/10/15 0458 04/10/15 1358  BP: 142/57 154/73 153/78 126/62  Pulse: 68 56 76 84  Temp: 99 F (37.2 C) 98.9 F (37.2 C) 98.4 F (36.9 C) 98.4 F (36.9 C)  TempSrc: Axillary Axillary Axillary Oral  Resp: _0 Height:      Weight:      SpO2: 94% 93% 92% 93%    Intake/Output Summary (Last 24 hours) at 04/10/15 1737 Last data filed at 04/10/15 1232  Gross per 24 hour  Intake    720 ml  Output      0 ml  Net    720  ml   Filed Weights   03/30/15 1327 03/31/15 0400  Weight: 68.04 kg (150 lb) 62.4 kg (137 lb 9.1 oz)    Exam:   General:  Awake, in nad  Cardiovascular: regular, s1, s2  Respiratory: normal resp effort, no wheezing  Abdomen: soft, nondistended  Musculoskeletal: perfused, no clubbing   Data Reviewed: Basic Metabolic Panel:  Recent Labs Lab 04/06/15 0500 04/07/15 0427 04/08/15 0526 04/09/15 0453 04/10/15 0425  NA 149* 151* 146* 148* 147*  K 4.4 4.3 3.7 3.7 3.6  CL 116* 113* 109 108 106  CO2 '26 31 31 '$ 33* 34*  GLUCOSE 94 106* 120* 93 124*  BUN 55* 45* 40* 33* 28*  CREATININE 1.77* 1.75* 1.41* 1.50* 1.30*  CALCIUM 8.3* 8.1* 7.7* 7.8* 7.8*    Liver Function Tests: No results for input(s): AST, ALT, ALKPHOS, BILITOT, PROT, ALBUMIN in the last 168 hours. No results for input(s): LIPASE, AMYLASE in the last 168 hours. No results for input(s): AMMONIA in the last 168 hours. CBC:  Recent Labs Lab 04/04/15 0516 04/05/15 0446 04/06/15 0500 04/07/15 0427 04/08/15 0526  WBC 14.7* 14.7* 13.8* 10.4 11.0*  NEUTROABS  --   --  11.1*  --   --   HGB 12.3* 12.0* 11.2* 10.4* 10.4*  HCT 38.6* 37.7* 34.6* 32.9* 33.0*  MCV 98.7 98.7 98.0 99.7 100.6*  PLT 49* 64* 87* 124* 190   Cardiac Enzymes: No results for input(s): CKTOTAL, CKMB, CKMBINDEX, TROPONINI in the last 168 hours. BNP (last 3 results)  Recent Labs  12/07/14 1906  BNP 44.9    ProBNP (last 3 results) No results for input(s): PROBNP in the last 8760 hours.  CBG:  Recent Labs Lab 04/07/15 0004  GLUCAP 105*    Recent Results (from the past 240 hour(s))  Culture, blood (routine x 2)     Status: None (Preliminary result)   Collection Time: 04/06/15  6:35 PM  Result Value Ref Range Status   Specimen Description BLOOD RIGHT HAND  Final   Special Requests BOTTLES DRAWN AEROBIC AND ANAEROBIC 10 CC  Final   Culture   Final    NO GROWTH 4 DAYS Performed at [facility]    Report Status PENDING  Incomplete  Culture, blood (routine x 2)     Status: None (Preliminary result)   Collection Time: 04/06/15  6:40 PM  Result Value Ref Range Status   Specimen Description BLOOD RIGHT HAND  Final   Special Requests BOTTLES DRAWN AEROBIC AND ANAEROBIC 10 CC  Final   Culture   Final    NO GROWTH 4 DAYS Performed at [facility]    Report Status PENDING  Incomplete     Studies: No results found.  Scheduled Meds: . amoxicillin-clavulanate  1 tablet Oral Q12H  . antiseptic oral rinse  7 mL Mouth Rinse q12n4p  . atorvastatin  20 mg Oral QHS  . benztropine  0.5 mg Oral BID  . chlorhexidine  15 mL Mouth/Throat BID  . divalproex  500 mg Oral BID  .  escitalopram  10 mg Oral Daily  . feeding supplement (ENSURE ENLIVE)  237 mL Oral TID BM  . omega-3 acid ethyl esters  2 g Oral BID  . pneumococcal 23 valent vaccine  0.5 mL Intramuscular Tomorrow-1000  . QUEtiapine  50 mg Oral BID   Continuous Infusions:   Principal Problem:   Sepsis ([place]) Active Problems:   Hospital-acquired pneumonia   Mental retardation   Hyperlipidemia   Parkinson's disease (  [place])   AKI (acute kidney injury) ([place])   Pressure ulcer   E coli bacteremia   Hypernatremia   Thrombocytopenia (HCC)   E. coli UTI   Acute renal failure [facility])   Essential hypertension   Dysphagia   Palliative care encounter   Fever   Endi Lagman, [place] [facility] Pager [phone]. If 7PM-7AM, please contact night-coverage at [email or web address], password [facility] 04/10/2015, 5:37 PM  LOS: 11 days

## 2015-04-10 NOTE — Progress Notes (Signed)
Daily Progress Note   Patient Name: Gary [name]       Date: 04/10/2015 DOB: Jul 17, 1940  Age: 75 y.o. MRN#: [id number] Attending Physician: Jerald Kief, MD Primary Care Physician: Cheral Bay, MD Admit Date: 03/30/2015  Reason for Consultation/Follow-up: Establishing goals of care  Subjective: Gary [name] appears very improved from when I saw him Friday. He is on room air, alert, and in no distress. He only became briefly agitated when he needed to use the bathroom (had already voided and had bowel movement in the bed) but with PT at bedside we walked him to the bathroom. He was very weak and walked with 2 assist with much difficulty.   I also spoke with Mr. Dowdy, nephew, who makes decisions for Gary [name]. I updated him on Gary [name]'s current status and he is very pleased that he has improved some. I explained that I am still concerned with Gary [name]'s overall prognosis and concern with his future risk of aspiration and continued decline. They do want to proceed with transition to SNF rehab with plan to go to SNF that could keep him longer term if necessary to make things easier on Gary [name]. We did discuss that he could potentially have the assistance from hospice after rehab depending on how things go for him to assist with comfort with further decline. Encouraged Mr. Dowdy to keep an eye on how Gary [name] progresses with SLP in rehab as I am worried most about this issue. Mr. Freddi Starr is very appreciative of the care provided for [facility].    Length of Stay: 11 days  Current Medications: Scheduled Meds:  . amoxicillin-clavulanate  1 tablet Oral Q12H  . antiseptic oral rinse  7 mL Mouth Rinse q12n4p  . atorvastatin  20 mg Oral QHS  . benztropine  0.5 mg Oral BID  . chlorhexidine  15 mL Mouth/Throat BID  . divalproex  500 mg  Oral BID  . escitalopram  10 mg Oral Daily  . feeding supplement (ENSURE ENLIVE)  237 mL Oral TID BM  . omega-3 acid ethyl esters  2 g Oral BID  . pneumococcal 23 valent vaccine  0.5 mL Intramuscular Tomorrow-1000  . QUEtiapine  50 mg Oral BID    Continuous Infusions:    PRN Meds: acetaminophen, albuterol, guaiFENesin-dextromethorphan, hydrOXYzine, LORazepam  Physical Exam: Physical Exam  Constitutional:  He appears well-developed and well-nourished.  HENT:  Head: Normocephalic and atraumatic.  Cardiovascular: Normal rate.   Pulmonary/Chest: Effort normal. No accessory muscle usage. No tachypnea. No respiratory distress.  Abdominal: Soft. Normal appearance.  Neurological: He is alert. He is disoriented.                Vital Signs: BP 126/62 mmHg  Pulse 84  Temp(Src) 98.4 F (36.9 C) (Oral)  Resp 18  Ht 5\' 5"  (1.651 m)  Wt 62.4 kg (137 lb 9.1 oz)  BMI 22.89 kg/m2  SpO2 93% SpO2: SpO2: 93 % O2 Device: O2 Device: Not Delivered O2 Flow Rate: O2 Flow Rate (L/min): 3 L/min  Intake/output summary:  Intake/Output Summary (Last 24 hours) at 04/10/15 1527 Last data filed at 04/10/15 1232  Gross per 24 hour  Intake    720 ml  Output      0 ml  Net    720 ml   LBM: Last BM Date: 04/10/15 Baseline Weight: Weight: 68.04 kg (150 lb) Most recent weight: Weight: 62.4 kg (137 lb 9.1 oz)       Palliative Assessment/Data: Flowsheet Rows        Most Recent Value   Intake Tab    Referral Department  Hospitalist   Unit at Time of Referral  Med/Surg Unit   Palliative Care Primary Diagnosis  Sepsis/Infectious Disease   Date Notified  04/04/15   Palliative Care Type  New Palliative care   Reason for referral  Clarify Goals of Care   Date of Admission  03/30/15   Date first seen by Palliative Care  04/05/15   # of days Palliative referral response time  1 Day(s)   # of days IP prior to Palliative referral  5   Clinical Assessment    Psychosocial & Spiritual Assessment     Palliative Care Outcomes       Additional Data Reviewed: CBC    Component Value Date/Time   WBC 11.0* 04/08/2015 0526   RBC 3.28* 04/08/2015 0526   HGB 10.4* 04/08/2015 0526   HCT 33.0* 04/08/2015 0526   PLT 190 04/08/2015 0526   MCV 100.6* 04/08/2015 0526   MCH 31.7 04/08/2015 0526   MCHC 31.5 04/08/2015 0526   RDW 15.3 04/08/2015 0526   LYMPHSABS 1.0 04/06/2015 0500   MONOABS 1.5* 04/06/2015 0500   EOSABS 0.1 04/06/2015 0500   BASOSABS 0.1 04/06/2015 0500    CMP     Component Value Date/Time   NA 147* 04/10/2015 0425   K 3.6 04/10/2015 0425   CL 106 04/10/2015 0425   CO2 34* 04/10/2015 0425   GLUCOSE 124* 04/10/2015 0425   BUN 28* 04/10/2015 0425   CREATININE 1.30* 04/10/2015 0425   CALCIUM 7.8* 04/10/2015 0425   PROT 6.1* 03/30/2015 1342   ALBUMIN 2.7* 03/30/2015 1342   AST 35 03/30/2015 1342   ALT 24 03/30/2015 1342   ALKPHOS 74 03/30/2015 1342   BILITOT 0.7 03/30/2015 1342   GFRNONAA 53* 04/10/2015 0425   GFRAA >60 04/10/2015 0425       Problem List:  Patient Active Problem List   Diagnosis Date Noted  . Fever   . Dysphagia   . Palliative care encounter   . Essential hypertension   . E. coli UTI 04/05/2015  . Acute renal failure (HCC)   . Thrombocytopenia (HCC) 04/04/2015  . Hypernatremia 04/02/2015  . Pressure ulcer 03/31/2015  . E coli bacteremia 03/31/2015  . Hospital-acquired pneumonia 03/30/2015  . Sepsis (HCC)  03/30/2015  . AKI (acute kidney injury) (HCC) 03/30/2015  . Mental retardation   . Hypertension   . Hyperlipidemia   . Parkinson's disease [facility])      Palliative Care Assessment & Plan    1.Code Status:  DNR    Code Status Orders        Start     Ordered   04/07/15 0908  Do not attempt resuscitation (DNR)   Continuous    Question Answer Comment  In the event of cardiac or respiratory ARREST Do not call a "code blue"   In the event of cardiac or respiratory ARREST Do not perform Intubation, CPR, defibrillation or  ACLS   In the event of cardiac or respiratory ARREST Use medication by any route, position, wound care, and other measures to relive pain and suffering. May use oxygen, suction and manual treatment of airway obstruction as needed for comfort.      04/07/15 0907       2. Goals of Care/Additional Recommendations:  Goal is to treat the treatable. Most concerned with placement for Cuyler somewhere he can get the care he needs and hopefully can stay if SNF level is needed. I did recommend considering hospice care after rehab.   Limitations on Scope of Treatment: Avoid Hospitalization  Desire for further Chaplaincy support:no  Psycho-social Needs: Caregiving  Support/Resources and Education on Hospice  3. Symptom Management:      1. No current symptoms. Continue therapies. Follow up SLP in rehab. Palliative f/u at SNF.   4. Palliative Prophylaxis:   Aspiration, Delirium Protocol and Frequent Pain Assessment  5. Prognosis: Could be < 6 months especially with continued aspiration and decline as described over the past few months.   6. Discharge Planning:  Skilled Nursing Facility for rehab with Palliative care service follow-up    Thank you for allowing the Palliative Medicine Team to assist in the care of this patient.   Time In: 1500 Time Out: 1530 Total Time Prolonged Time Billed  no         Ulice Bold, NP  04/10/2015, 3:27 PM  Please contact Palliative Medicine Team phone at [phone] for questions and concerns.

## 2015-04-11 LAB — CULTURE, BLOOD (ROUTINE X 2)
CULTURE: NO GROWTH
Culture: NO GROWTH

## 2015-04-11 MED ORDER — AMOXICILLIN-POT CLAVULANATE 875-125 MG PO TABS: 1.0000 | ORAL_TABLET | Freq: Two times a day (BID) | ORAL | Status: AC

## 2015-04-11 NOTE — Care Management Important Message (Signed)
Important Message  Patient Details  Name: Gary [name] MRN: [id number] Date of Birth: 03/06/1941   Medicare Important Message Given:  Yes    Haskell FlirtJamison, Goldman Birchall 04/11/2015, 11:48 AMImportant Message  Patient Details  Name: Gary [name] MRN: [id number] Date of Birth: 05/29/1940   Medicare Important Message Given:  Yes    Haskell FlirtJamison, Arrie Borrelli 04/11/2015, 11:47 AM

## 2015-04-11 NOTE — Discharge Summary (Addendum)
Physician Discharge Summary  Gary [name] JHE:[id number] DOB: 1940-07-27 DOA: 03/30/2015  PCP: Maylon Peppers, MD  Admit date: 03/30/2015 Discharge date: 04/11/2015  Time spent: 20 minutes  Recommendations for Outpatient Follow-up:  1. Follow up with PCP in 2-3 weeks  2. Recommend Palliative Care consult at facility if possible  Discharge Diagnoses:  Principal Problem:   Sepsis ([place]) Active Problems:   Hospital-acquired pneumonia   Mental retardation   Hyperlipidemia   Parkinson's disease ([place])   AKI (acute kidney injury) ([place])   Pressure ulcer   E coli bacteremia   Hypernatremia   Thrombocytopenia ([place])   E. coli UTI   Acute renal failure ([place])   Essential hypertension   Dysphagia   Palliative care encounter   Fever   Dyspnea   Discharge Condition: Improved  Diet recommendation: Dysphagia 1 with honey thick liquids  Filed Weights   03/30/15 1327 03/31/15 0400  Weight: 68.04 kg (150 lb) 62.4 kg (137 lb 9.1 oz)    History of present illness:  Please review dictated H and P from 12/29 for details. Briefly, 75 y.o. male with a PMH of mental retardation, from [facility], who was sent to the ED for evaluation of altered mental status, hypoxia and low BP. Upon initial evaluation in the ED, he was found to be febrile, hypotensive, and with AKI. CXR shows pneumonia. Patient also noted to have a Escherichia coli bacteremia and Escherichia coli UTI. IV antibiotics were narrowed to IV Rocephin but patient still spiking fevers. ID was consulted and is following. Palliative care on board as well.  Hospital Course:  #1 sepsis secondary to Escherichia coli bacteremia, Escherichia coli UTI, aspiration pneumonia versus HCAP Patient intermittently spiking fevers. Leukocytosis resolved. Patient has been evaluated by speech therapy and patient with chronic aspiration risks. Patient with poor outcomes with the sofa score of 2-3. Pro calcitonin & lactic acid was elevated  on admission. Patient has been on several different IV antibiotics since admission as outlined below. Switched to IV Zosyn 04/05/14 due to ongoing temperature spikes. Repeat blood cultures 21/5: Negative to date. Chest x-ray 1/5: Patchy bilateral lung infiltrates. Continued supportive care. Continue dysphagia 1 diet with aspiration precautions. Follow.  Rounding team had discussed with palliative care team on 1/6: They have met with patient's nephew and indicate that he wishes to continue management over the weekend and regroup on today. However if patient declines over the weekend, he is apparently open to transitioning to comfort oriented care.  - Patient defervesced on IV Zosyn. Last high fever 101.8 on 1/5 at midnight. - Dr. Algis Liming had discussed with Dr. Baxter Flattery, infectious disease on 1/8: Recommendation for Augmentin 875 MG twice a day Treatment course is for 4 more days. ID has since signed off  #2 Escherichia coli bacteremia Likely seeded from Escherichia coli UTI. Patient has been on several appropriate IV Abx since admission. Patient was spiking fevers. Patient with increased aspiration risk. ID following.  Abx as above  #3 Escherichia coli UTI As above - likely completed treatment for this indication.  #4 probable aspiration pneumonia versus HCAP Patient has been assessed by speech therapy and patient underwent a modified barium swallow, patient noted to be a severe aspiration risk and likely has been chronically aspirating. Patient currently on a dysphagia 1 diet with aspiration precautions. Abx Mx as above Hypoxia overnight 1/5, likely related to this and possible ongoing aspiration - Improved. As per nursing, does not keep on oxygen consistently.  #5 hyperkalemia/hypernatremia Hyperkalemia has resolved.  Patient persistently hypernatremic in the 146-151 range over the last 4 days despite being on hypotonic IV fluids. This may be secondary to decreased free water intake by mouth.  We will increase IV D5 and follow BMP in a.m. Patient having multiple soft BMs but no frank diarrhea. As per RN, eating reasonably. Zosyn probably adding to sodium load-discontinued 1/8.  #6 acute renal failure Likely secondary to prerenal azotemia as patient was noted to be hypotensive on admission. Renal function trending down. Continue hydration. Follow. Creatinine has plateaued in the 1.4-1.5 range over the last 2 days.  #7 probable Parkinson's disease Unclear diagnosis. Nephew was not aware of this diagnosis. Patient not on any medications.  #8 pressure ulcer Present on admission. Skin care per wound care nurse and recommendations.  #9 hyperlipidemia Continued statins. Hold fish oil.  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 Depakote levels were therapeutic  #11 thrombocytopenia No evidence of bleeding. Lovenox had been discontinued. Thrombocytopenia resolved on 1/7. HIT panel negative.  #13 prognosis Patient with a poor prognosis with a history of mental retardation living in a senior living center presenting with probable aspiration pneumonia versus HCAP noted to be septic source likely Escherichia coli bacteremia and Escherichia coli UTI. Patient also noted to have an acute renal failure. Patient has been assessed by speech therapy and deemed to be severe aspiration risk. Patient on dysphagia 1 diet with aspiration precautions. Patient on empiric IV antibiotics. Palliative care consultation pending for goals of care - during this admit, code status was changed to DNR. -Per palliative care, prognosis may be <6 mos with continued aspiration -Recommend Palliative Care consult at facility  Procedures:  Modified barium swallow 04/04/2015  Chest x-ray 03/30/2015, 04/01/2015  Foley catheter-DC'd 1/8.  Consultations:  Palliative Care  Infectious Disease  Discharge Exam: Filed Vitals:   04/10/15 0458 04/10/15  1358 04/10/15 2246 04/11/15 0501  BP: 153/78 126/62 154/68 142/63  Pulse: 76 84 56 63  Temp: 98.4 F (36.9 C) 98.4 F (36.9 C) 98.7 F (37.1 C) 98.3 F (36.8 C)  TempSrc: Axillary Oral Oral Axillary  Resp: _0 Height:      Weight:      SpO2: 92% 93% 100% 92%    General: awake, in nad Cardiovascular: regular, s1, s2 Respiratory: normal resp effort no wheezing  Discharge Instructions     Medication List    TAKE these medications        amoxicillin-clavulanate 875-125 MG tablet  Commonly known as:  AUGMENTIN  Take 1 tablet by mouth every 12 (twelve) hours.     atorvastatin 20 MG tablet  Commonly known as:  LIPITOR  Take 20 mg by mouth at bedtime.     benztropine 0.5 MG tablet  Commonly known as:  COGENTIN  Take 0.5 mg by mouth 2 (two) times daily.     chlorhexidine 0.12 % solution  Commonly known as:  PERIDEX  Use as directed 15 mLs in the mouth or throat 2 (two) times daily.     divalproex 500 MG DR tablet  Commonly known as:  DEPAKOTE  Take 500 mg by mouth 2 (two) times daily.     ENSURE  Take 237 mLs by mouth 3 (three) times daily between meals.     escitalopram 10 MG tablet  Commonly known as:  LEXAPRO  Take 10 mg by mouth daily.     etodolac 400 MG tablet  Commonly known as:  LODINE  Take 400 mg by mouth 2 (two) times daily.     fish oil-omega-3 fatty acids 1000 MG capsule  Take 2 g by mouth 2 (two) times daily.     hydrOXYzine 25 MG tablet  Commonly known as:  ATARAX/VISTARIL  Take 25 mg by mouth every 4 (four) hours as needed for itching.     ibuprofen 800 MG tablet  Commonly known as:  ADVIL,MOTRIN  Take 1 tablet (800 mg total) by mouth every 8 (eight) hours as needed.     LORazepam 0.5 MG tablet  Commonly known as:  ATIVAN  Take 0.5 mg by mouth 2 (two) times daily.     LORazepam 0.5 MG tablet  Commonly known as:  ATIVAN  Take 0.5 mg by mouth every 12 (twelve) hours as needed (for agitation).     perphenazine 4 MG tablet   Commonly known as:  TRILAFON  Take 4 mg by mouth daily after breakfast. Reported on 03/30/2015     QUEtiapine 50 MG tablet  Commonly known as:  SEROQUEL  Take 50 mg by mouth 2 (two) times daily.     triamcinolone 0.025 % cream  Commonly known as:  KENALOG  Apply 1 application topically every 6 (six) hours as needed (for itching).     TYLENOL 500 MG tablet  Generic drug:  acetaminophen  Take 1,000 mg by mouth every 12 (twelve) hours as needed (for pain).       No Known Allergies Follow-up Information    Follow up with [facility], MD. Schedule an appointment as soon as possible for a visit in 2 weeks.   Specialty:  Family Medicine   Why:  Hospital follow up   Contact information:   [place] [address] [phone]        The results of significant diagnostics from this hospitalization (including imaging, microbiology, ancillary and laboratory) are listed below for reference.    Significant Diagnostic Studies: Dg Chest 2 View  03/30/2015  CLINICAL DATA:  Altered mental status.  Abnormal lung exam. EXAM: CHEST  2 VIEW COMPARISON:  12/17/2014 and 12/07/2014 FINDINGS: Artifact overlies chest. Heart size is normal. Mediastinal shadows are normal. There is patchy density in both lower lobes consistent with atelectasis and basilar pneumonia. Upper lungs are clear. No effusions. No acute bone finding. IMPRESSION: Abnormal patchy density in both lower lobes consistent with pneumonia. Electronically Signed   By: Nelson Chimes M.D.   On: 03/30/2015 13:42   Dg Chest Port 1 View  04/06/2015  CLINICAL DATA:  Fever today. EXAM: PORTABLE CHEST 1 VIEW COMPARISON:  04/01/2015. FINDINGS: The heart is mildly enlarged but stable. Stable tortuosity and ectasia of the thoracic aorta. Patchy bilateral airspace opacities most likely reflecting pneumonia. No pleural effusion. The bony thorax is intact. IMPRESSION: Patchy bilateral lung infiltrates.  Electronically Signed   By: Marijo Sanes M.D.   On: 04/06/2015 19:26   Dg Chest Port 1 View  04/01/2015  CLINICAL DATA:  Short of breath.  Dyspnea.  Mental retardation. EXAM: PORTABLE CHEST 1 VIEW COMPARISON:  03/30/2015 FINDINGS: Minimally motion degraded AP portable view. Patient rotated left. Cardiomegaly accentuated by AP portable technique. Possible small left pleural effusion. No pneumothorax. Low lung volumes with resultant pulmonary interstitial prominence. Progression of left worse than right bibasilar airspace disease. IMPRESSION: Cardiomegaly and low lung volumes. Progressive bibasilar airspace disease, suspicious for infection or aspiration. Electronically Signed   By: Abigail Miyamoto M.D.   On: 04/01/2015 14:58  Dg Swallowing Func-speech Pathology  04/04/2015  Objective Swallowing Evaluation:   Patient Details Name: Gary [name] MRN: [id number] Date of Birth: [birth year] Today's Date: 04/04/2015 Time: SLP Start Time (ACUTE ONLY): 1415-SLP Stop Time (ACUTE ONLY): 1445 SLP Time Calculation (min) (ACUTE ONLY): 30 min Past Surgical History Procedure Laterality Date . Cataract extraction w/ intraocular lens  implant, bilateral   HPI: 75 yo male adm to [facility] with AMS, hypoxia, decreased BP - found to be septic and diagnosed with pna.  Pt has h/o MR, dysphagia with ED admit 12/2014 for choking on chicken and required chest compressions at that time, fall July 2016, pressure ulcer and ? Parkinson's (not on meds per MD note).  Swallow eval ordered, pt coughing with liquids but tolerating applesauce welll per order.  RN reports pt aspirating with thinner consistencies.   Subjective: pt asleep in bed but easily arousable Assessment / Plan / Recommendation CHL IP CLINICAL IMPRESSIONS 04/04/2015 Therapy Diagnosis Moderate oral phase dysphagia;Moderate pharyngeal phase dysphagia;Mild cervical esophageal phase dysphagia Clinical Impression Moderate oropharyngeal and mild cervical esophageal dysphagia noted.  Pt with  baseline congested cough prior to barium administeration - causing SLP to suspect secretion aspiration.  Oral transiting is ill controlled with pt essentially dumping food/drink into pharynx - oral transiting is very rapid.  Mild tongue base and pharyngeal residuals noted without pt awareness and unfortunately pt did not dry swallow to faciliate clearance.  Pt did aspirate with thin via cup with cough response that was not productive.  Aspiration occured due to spillage into larynx before swallow triggering.  Very poor "mastication" of moist cracker noted with pt essentially swallowing cracker whole. Cracker lodged at UES without pt sensation- pudding aided clearance.  Thicker consistencies compensate for ill oral control but do result in pharyngeal residuals pt does not sense. Pt will be chornic aspiration risk regardless of consistency and SLP suspect pt is aspirating secretions and therefore option may be to allow pt diet for comfort with precautions.  Use of modified diet may mitigate aspiration but will not be preventative.  SLP to follow up.  Of note, called nurse Anderson Malta and requested respiratory therapy see pt if indicated - as at testing became wheezy after aspiration episode.  Impact on safety and function Severe aspiration risk   CHL IP TREATMENT RECOMMENDATION 04/04/2015 Treatment Recommendations Therapy as outlined in treatment plan below;F/U MBS in --- days (Comment)   Prognosis 04/04/2015 Prognosis for Safe Diet Advancement Guarded Barriers to Reach Goals Severity of deficits;Cognitive deficits Barriers/Prognosis Comment -- CHL IP DIET RECOMMENDATION 04/04/2015 SLP Diet Recommendations NPO- vs po with accepted risks  Liquid Administration via Cup;No straw;Spoon Medication Administration Crushed with puree Compensations Slow rate;Small sips/bites Postural Changes --   CHL IP OTHER RECOMMENDATIONS 04/04/2015 Recommended Consults -- Oral Care Recommendations Oral care BID Other Recommendations Have oral suction  available;Order thickener from pharmacy   CHL IP FOLLOW UP RECOMMENDATIONS 04/04/2015 Follow up Recommendations Other (comment)   CHL IP FREQUENCY AND DURATION 04/04/2015 Speech Therapy Frequency (ACUTE ONLY) min 1 x/week Treatment Duration 1 week      CHL IP ORAL PHASE 04/04/2015 Oral Phase Impaired Oral - Pudding Teaspoon -- Oral - Pudding Cup -- Oral - Honey Teaspoon Reduced posterior propulsion;Premature spillage;Decreased bolus cohesion;Weak lingual manipulation Oral - Honey Cup Premature spillage;Decreased bolus cohesion;Reduced posterior propulsion;Weak lingual manipulation Oral - Nectar Teaspoon Premature spillage;Decreased bolus cohesion;Weak lingual manipulation;Reduced posterior propulsion;Lingual pumping Oral - Nectar Cup Premature spillage;Decreased bolus cohesion;Reduced posterior propulsion;Weak lingual manipulation Oral - Nectar Straw --  Oral - Thin Teaspoon Weak lingual manipulation;Decreased bolus cohesion;Premature spillage;Reduced posterior propulsion Oral - Thin Cup Premature spillage;Decreased bolus cohesion;Weak lingual manipulation;Reduced posterior propulsion Oral - Thin Straw Weak lingual manipulation;Reduced posterior propulsion;Piecemeal swallowing Oral - Puree Weak lingual manipulation;Premature spillage;Lingual pumping;Decreased bolus cohesion Oral - Mech Soft Premature spillage;Decreased bolus cohesion;Lingual pumping;Impaired mastication;Weak lingual manipulation Oral - Regular -- Oral - Multi-Consistency -- Oral - Pill -- Oral Phase - Comment pt with rapid oral transiting that is poorly controlled, resulting in premature spillage of barium into pharynx  CHL IP PHARYNGEAL PHASE 04/04/2015 Pharyngeal Phase Impaired Pharyngeal- Pudding Teaspoon -- Pharyngeal -- Pharyngeal- Pudding Cup -- Pharyngeal -- Pharyngeal- Honey Teaspoon Reduced tongue base retraction Pharyngeal -- Pharyngeal- Honey Cup Reduced tongue base retraction Pharyngeal -- Pharyngeal- Nectar Teaspoon Reduced tongue base  retraction Pharyngeal -- Pharyngeal- Nectar Cup Reduced tongue base retraction Pharyngeal -- Pharyngeal- Nectar Straw -- Pharyngeal -- Pharyngeal- Thin Teaspoon Reduced tongue base retraction;Pharyngeal residue - pyriform;Pharyngeal residue - valleculae Pharyngeal -- Pharyngeal- Thin Cup Reduced tongue base retraction;Pharyngeal residue - valleculae;Pharyngeal residue - pyriform Pharyngeal -- Pharyngeal- Thin Straw Reduced tongue base retraction;Pharyngeal residue - pyriform;Pharyngeal residue - valleculae Pharyngeal -- Pharyngeal- Puree Reduced tongue base retraction;Pharyngeal residue - cp segment;Pharyngeal residue - valleculae;Pharyngeal residue - pyriform Pharyngeal -- Pharyngeal- Mechanical Soft Reduced tongue base retraction;Pharyngeal residue - valleculae;Pharyngeal residue - pyriform Pharyngeal -- Pharyngeal- Regular -- Pharyngeal -- Pharyngeal- Multi-consistency -- Pharyngeal -- Pharyngeal- Pill -- Pharyngeal -- Pharyngeal Comment pt with extended breath hold when swallowing thin liquids - with post=swallow overt coughing, aspiration of thin via cup noted due to premature spillage into pharynx, pt does not sense residuals nor does he dry swallow on command to faciliate clearance  CHL IP CERVICAL ESOPHAGEAL PHASE 04/04/2015 Cervical Esophageal Phase Impaired Pudding Teaspoon -- Pudding Cup -- Honey Teaspoon -- Honey Cup -- Nectar Teaspoon -- Nectar Cup -- Nectar Straw -- Thin Teaspoon -- Thin Cup -- Thin Straw -- Puree -- Mechanical Soft -- Regular -- Multi-consistency -- Pill -- Cervical Esophageal Comment decreased UES opening resulting in residuals at UES/pyriform sinus region intermittently, pt did not dry swallow to clear Luanna Salk, [place] [facility] SLP [phone]               Microbiology: Recent Results (from the past 240 hour(s))  Culture, blood (routine x 2)     Status: None (Preliminary result)   Collection Time: 04/06/15  6:35 PM  Result Value Ref Range Status   Specimen Description BLOOD RIGHT  HAND  Final   Special Requests BOTTLES DRAWN AEROBIC AND ANAEROBIC 10 CC  Final   Culture   Final    NO GROWTH 4 DAYS Performed at [facility]    Report Status PENDING  Incomplete  Culture, blood (routine x 2)     Status: None (Preliminary result)   Collection Time: 04/06/15  6:40 PM  Result Value Ref Range Status   Specimen Description BLOOD RIGHT HAND  Final   Special Requests BOTTLES DRAWN AEROBIC AND ANAEROBIC 10 CC  Final   Culture   Final    NO GROWTH 4 DAYS Performed at [facility]    Report Status PENDING  Incomplete     Labs: Basic Metabolic Panel:  Recent Labs Lab 04/06/15 0500 04/07/15 0427 04/08/15 0526 04/09/15 0453 04/10/15 0425  NA 149* 151* 146* 148* 147*  K 4.4 4.3 3.7 3.7 3.6  CL 116* 113* 109 108 106  CO2 _0 33* 34*  GLUCOSE 94 106* 120* 93 124*  BUN 55* 45* 40* 33* 28*  CREATININE 1.77* 1.75* 1.41* 1.50* 1.30*  CALCIUM 8.3* 8.1* 7.7* 7.8* 7.8*   Liver Function Tests: No results for input(s): AST, ALT, ALKPHOS, BILITOT, PROT, ALBUMIN in the last 168 hours. No results for input(s): LIPASE, AMYLASE in the last 168 hours. No results for input(s): AMMONIA in the last 168 hours. CBC:  Recent Labs Lab 04/05/15 0446 04/06/15 0500 04/07/15 0427 04/08/15 0526  WBC 14.7* 13.8* 10.4 11.0*  NEUTROABS  --  11.1*  --   --   HGB 12.0* 11.2* 10.4* 10.4*  HCT 37.7* 34.6* 32.9* 33.0*  MCV 98.7 98.0 99.7 100.6*  PLT 64* 87* 124* 190   Cardiac Enzymes: No results for input(s): CKTOTAL, CKMB, CKMBINDEX, TROPONINI in the last 168 hours. BNP: BNP (last 3 results)  Recent Labs  12/07/14 1906  BNP 44.9    ProBNP (last 3 results) No results for input(s): PROBNP in the last 8760 hours.  CBG:  Recent Labs Lab 04/07/15 0004  GLUCAP 105*    Signed:  CHIU, STEPHEN K  [facility] 04/11/2015, 12:17 PM

## 2015-04-11 NOTE — Progress Notes (Signed)
04/11/15  1505  [facility]ed Report [facility] [phone]. [facility].

## 2015-04-11 NOTE — Progress Notes (Signed)
Mr. Jobe Markerldrige left with EMS via stretcher.  Patient was alert and as baseline.  VS stable.

## 2015-04-11 NOTE — Clinical Social Work Placement (Signed)
CSW spoke with patient's nephew, Baldo AshCarl who [facility] - anticipating discharge today.     [place] MaxinKelly Lawrence Mitch, LCSW [facility] Clinical Social Worker cell #: [phone]    CLINICAL SOCIAL WORK PLACEMENT  NOTE  Date:  04/11/2015  Patient Details  Name: Laure KidneyRoy Clair Biagini MRN: [id number] Date of Birth: 07/12/1940  Clinical Social Work is seeking post-discharge placement for this patient at the Skilled  Nursing Facility level of care (*CSW will initial, date and re-position this form in  chart as items are completed):  Yes   Patient/family provided with [facility] Clinical Social Work Department's list of facilities offering this level of care within the geographic area requested by the patient (or if unable, by the patient's family).  Yes   Patient/family informed of their freedom to choose among providers that offer the needed level of care, that participate in Medicare, Medicaid or managed care program needed by the patient, have an available bed and are willing to accept the patient.  Yes   Patient/family informed of 's ownership interest in [facility] and [facility], as well as of the fact that they are under no obligation to receive care at these facilities.  PASRR submitted to EDS on 04/07/15     PASRR number received on 04/07/15     Existing PASRR number confirmed on       FL2 transmitted to all facilities in geographic area requested by pt/family on 04/07/15     FL2 transmitted to all facilities within larger geographic area on       Patient informed that his/her managed care company has contracts with or will negotiate with certain facilities, including the following:        Yes   Patient/family informed of bed offers received.  [facility]     Physician recommends and patient chooses bed at      [facility]  .  Patient to be  transferred to facility by       Patient family notified on   of transfer.  Name of family member notified:        PHYSICIAN       Additional Comment:    _______________________________________________ Arlyss RepressHarrison, Miasia Crabtree F, LCSW 04/11/2015, 11:59 AM

## 2015-04-11 NOTE — Clinical Social Work Placement (Signed)
[facility]. Patient & nephew, Gary [name] aware. Discharge packet given to RN, Dekina. [facility] called for transport to pickup at 3:30pm.     Lincoln MaxinKelly Tequila Rottmann, LCSW [facility] Clinical Social Worker cell #: [phone]    CLINICAL SOCIAL WORK PLACEMENT  NOTE  Date:  04/11/2015  Patient Details  Name: Gary [name] MRN: [id number] Date of Birth: 01/02/1941  Clinical Social Work is seeking post-discharge placement for this patient at the Skilled  Nursing Facility level of care (*CSW will initial, date and re-position this form in  chart as items are completed):  Yes   Patient/family provided with [facility] Clinical [facility] Work Department's list of facilities offering this level of care within the geographic area requested by the patient (or if unable, by the patient's family).  Yes   Patient/family informed of their freedom to choose among providers that offer the needed level of care, that participate in Medicare, Medicaid or managed care program needed by the patient, have an available bed and are willing to accept the patient.  Yes   Patient/family informed of [facility]'s ownership interest in [facility] and [facility], as well as of the fact that they are under no obligation to receive care at these facilities.  PASRR submitted to [employer] on 04/07/15     PASRR number received on 04/07/15     Existing PASRR number confirmed on       FL2 transmitted to all facilities in geographic area requested by pt/family on 04/07/15     FL2 transmitted to all facilities within larger geographic area on       Patient informed that his/her managed care company has contracts with or will negotiate with certain facilities, including the following:        Yes   Patient/family informed of bed offers received.  [facility]     Physician recommends and patient chooses bed at      [facility] 04[facility]11/[facility].  Patient to be transferred to facility by [facility]     Patient family notified on 04/11/15 of transfer.  Name of family member notified:  patient's nephew, Gary [name] via phone     PHYSICIAN       Additional Comment:    _______________________________________________ Arlyss RepressHarrison, Taila Basinski F, LCSW 04/11/2015, 2:46 PM

## 2015-05-03 DEATH — deceased

## 2016-06-14 IMAGING — CR DG CHEST 2V
2 series · 2 of 2 positions shown · non-contrast
Comparison: 12/07/2014

CLINICAL DATA: Mental retardation, shortness of breath, choking

EXAM:
CHEST  2 VIEW

[w chest lat]
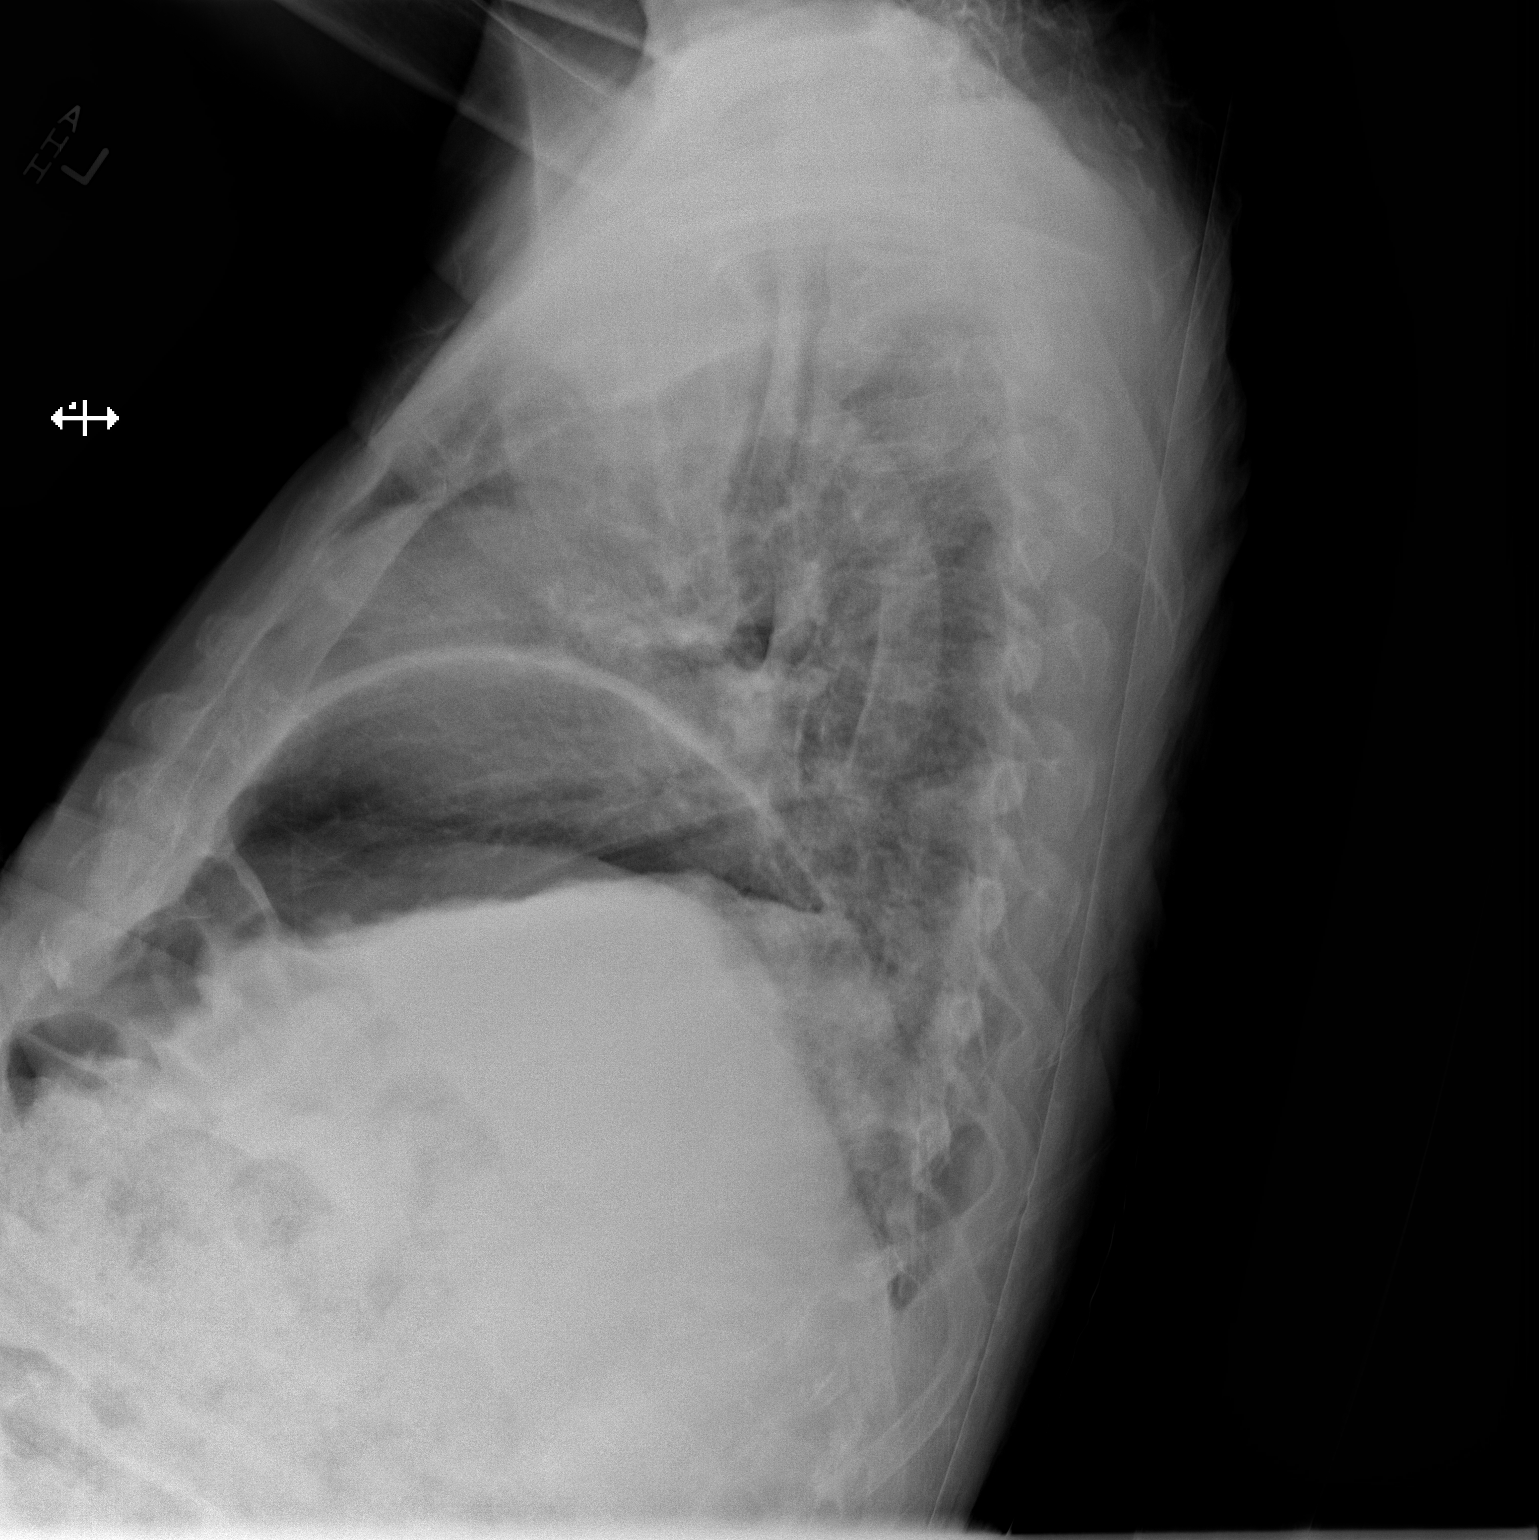

[x chest ap]
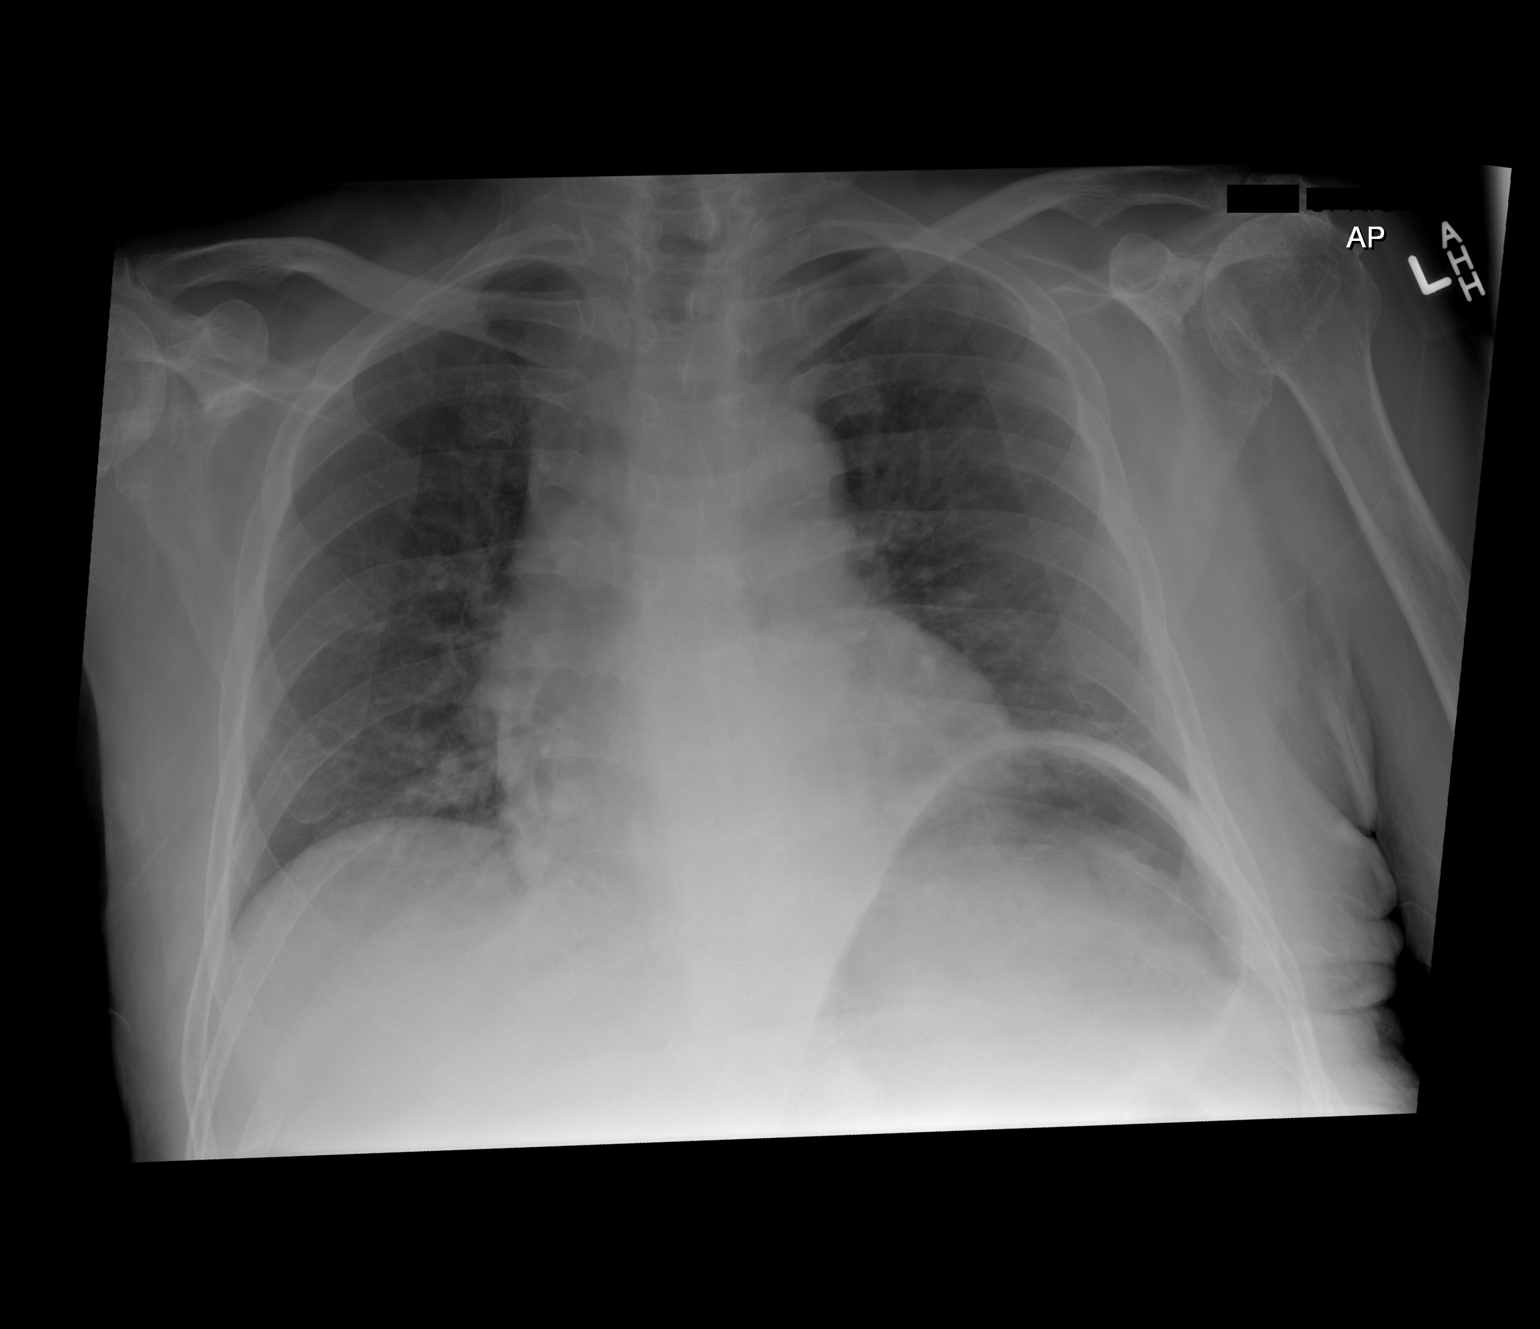

[2 of 2 positions shown; findings below may reference images not displayed]

FINDINGS: Mild cardiomegaly with vascular congestion and basilar atelectasis.
Low lung volumes. No focal pneumonia, collapse or consolidation. No
edema, effusion or pneumothorax. Trachea midline. Atherosclerosis of
the aorta. Gastric distention evident beneath the left
hemidiaphragm.
IMPRESSION: Low volume exam with minor atelectasis

Mild cardiomegaly with vascular congestion

No significant interval change or superimposed acute process.

## 2016-09-27 IMAGING — CR DG CHEST 1V PORT
1 series · 1 of 1 positions shown · non-contrast
Comparison: 03/30/2015

CLINICAL DATA: Short of breath.  Dyspnea.  Mental retardation.

EXAM:
PORTABLE CHEST 1 VIEW

[AP]
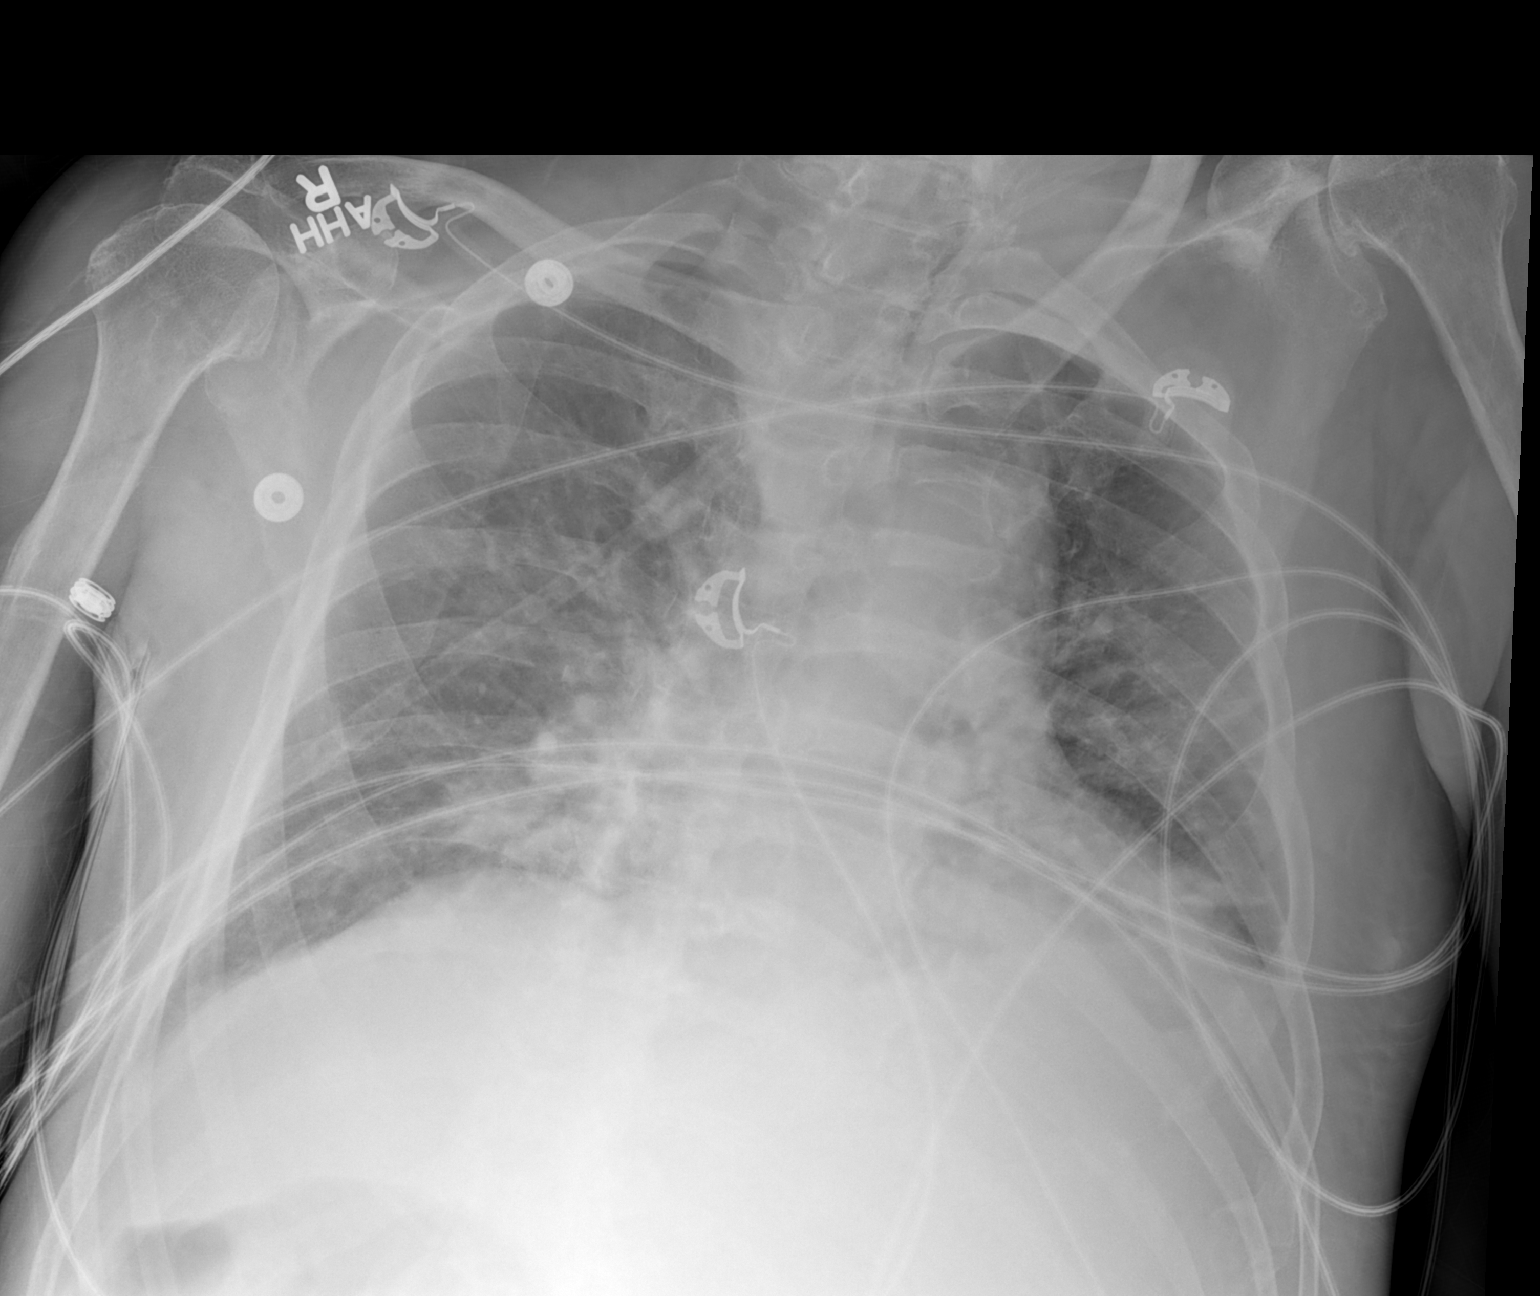

[1 of 1 positions shown; findings below may reference images not displayed]

FINDINGS: Minimally motion degraded AP portable view. Patient rotated left.
Cardiomegaly accentuated by AP portable technique. Possible small
left pleural effusion. No pneumothorax. Low lung volumes with
resultant pulmonary interstitial prominence. Progression of left
worse than right bibasilar airspace disease.
IMPRESSION: Cardiomegaly and low lung volumes.

Progressive bibasilar airspace disease, suspicious for infection or
aspiration.

## 2016-10-02 IMAGING — DX DG CHEST 1V PORT
1 series · 1 of 1 positions shown · non-contrast
Comparison: 04/01/2015.

CLINICAL DATA: Fever today.

EXAM:
PORTABLE CHEST 1 VIEW

[chest ap]
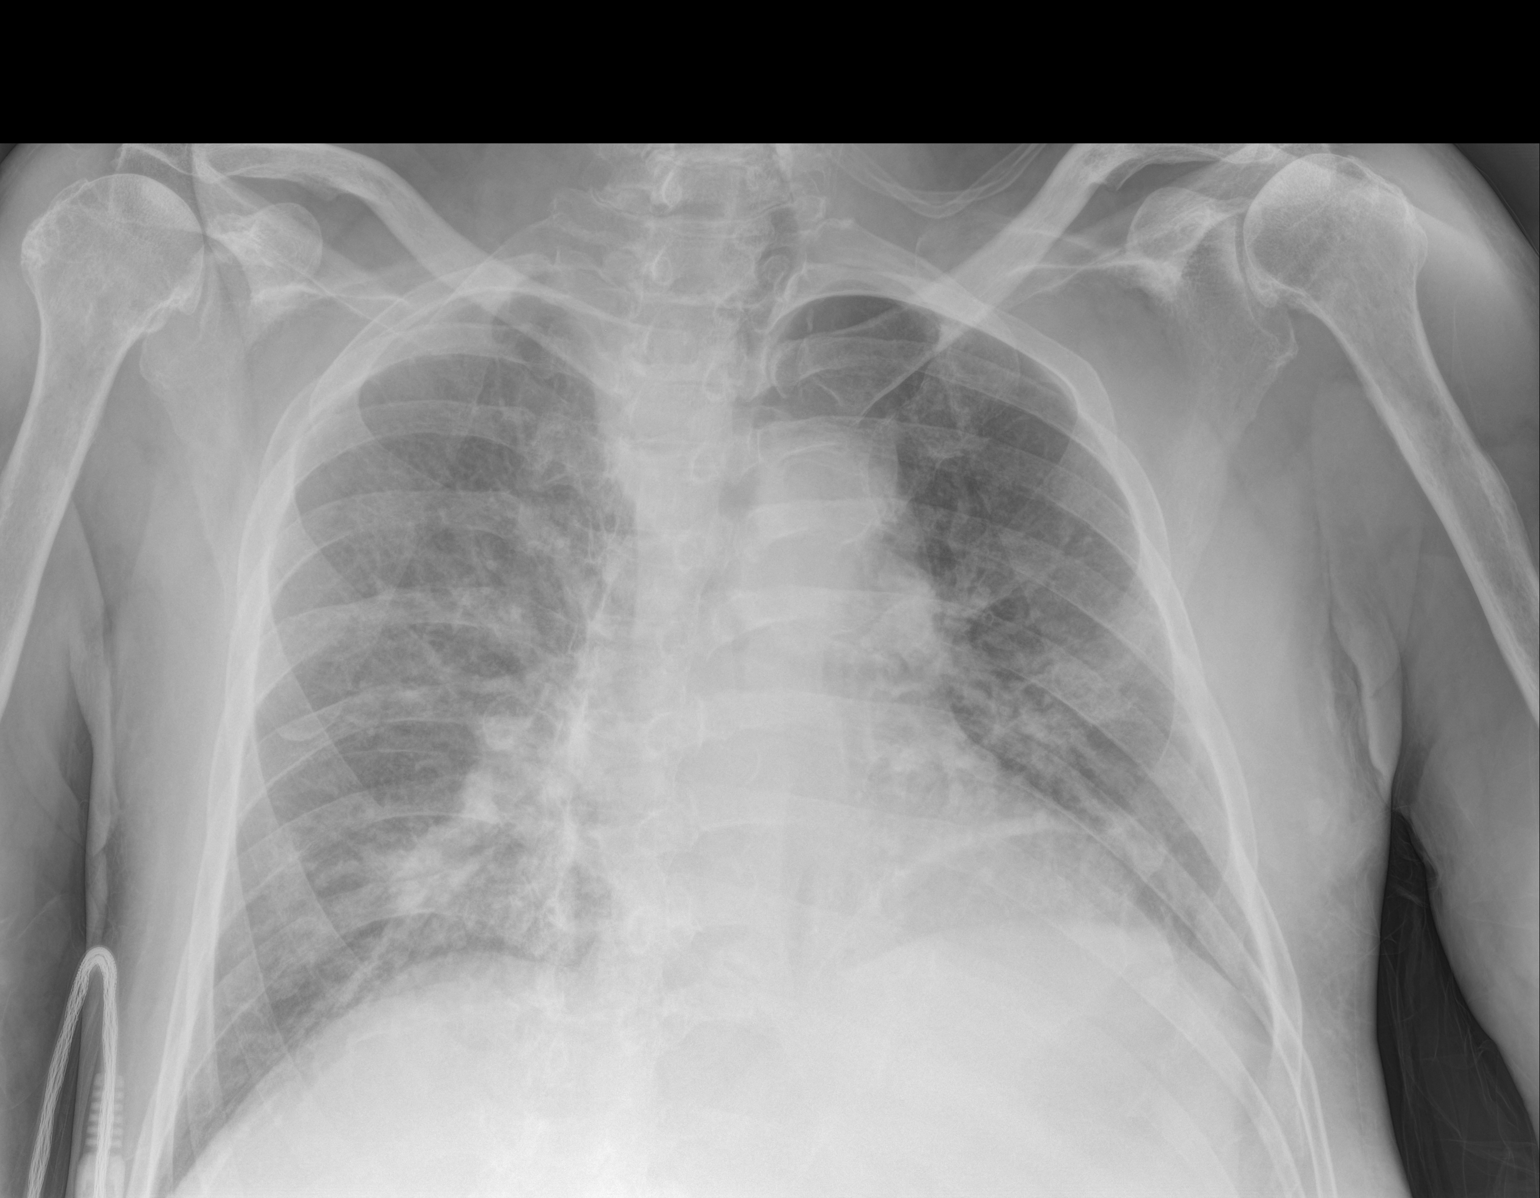

[1 of 1 positions shown; findings below may reference images not displayed]

FINDINGS: The heart is mildly enlarged but stable. Stable tortuosity and
ectasia of the thoracic aorta. Patchy bilateral airspace opacities
most likely reflecting pneumonia. No pleural effusion. The bony
thorax is intact.
IMPRESSION: Patchy bilateral lung infiltrates.
